# Patient Record
Sex: Female | Born: 1985 | Race: White | Hispanic: No | Marital: Married | Smoking: Current every day smoker
Health system: Southern US, Community
[De-identification: ages and names within clinical notes are randomized; demographics above are authoritative.]

## PROBLEM LIST (undated history)

## (undated) ENCOUNTER — Inpatient Hospital Stay (HOSPITAL_COMMUNITY): Payer: Self-pay

## (undated) ENCOUNTER — Ambulatory Visit: Admission: EM | Payer: Self-pay

## (undated) DIAGNOSIS — IMO0002 Reserved for concepts with insufficient information to code with codable children: Secondary | ICD-10-CM

## (undated) DIAGNOSIS — Z87440 Personal history of urinary (tract) infections: Secondary | ICD-10-CM

## (undated) DIAGNOSIS — N943 Premenstrual tension syndrome: Secondary | ICD-10-CM

## (undated) DIAGNOSIS — F419 Anxiety disorder, unspecified: Secondary | ICD-10-CM

## (undated) DIAGNOSIS — R002 Palpitations: Secondary | ICD-10-CM

## (undated) DIAGNOSIS — E079 Disorder of thyroid, unspecified: Secondary | ICD-10-CM

## (undated) DIAGNOSIS — R079 Chest pain, unspecified: Secondary | ICD-10-CM

## (undated) DIAGNOSIS — E039 Hypothyroidism, unspecified: Secondary | ICD-10-CM

## (undated) DIAGNOSIS — R0602 Shortness of breath: Secondary | ICD-10-CM

## (undated) DIAGNOSIS — R87619 Unspecified abnormal cytological findings in specimens from cervix uteri: Secondary | ICD-10-CM

## (undated) DIAGNOSIS — Z91014 Allergy to mammalian meats: Secondary | ICD-10-CM

## (undated) DIAGNOSIS — G43909 Migraine, unspecified, not intractable, without status migrainosus: Secondary | ICD-10-CM

## (undated) HISTORY — DX: Shortness of breath: R06.02

## (undated) HISTORY — PX: COLPOSCOPY: SHX161

## (undated) HISTORY — PX: WISDOM TOOTH EXTRACTION: SHX21

## (undated) HISTORY — DX: Premenstrual tension syndrome: N94.3

## (undated) HISTORY — DX: Personal history of urinary (tract) infections: Z87.440

## (undated) HISTORY — PX: OTHER SURGICAL HISTORY: SHX169

---
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
## 2006-01-04 ENCOUNTER — Ambulatory Visit: Payer: Self-pay | Admitting: Obstetrics and Gynecology

## 2006-01-09 ENCOUNTER — Ambulatory Visit (HOSPITAL_COMMUNITY): Admission: RE | Admit: 2006-01-09 | Discharge: 2006-01-09 | Payer: Self-pay | Admitting: Obstetrics and Gynecology

## 2008-04-28 ENCOUNTER — Emergency Department (HOSPITAL_COMMUNITY): Admission: EM | Admit: 2008-04-28 | Discharge: 2008-04-29 | Payer: Self-pay | Admitting: Emergency Medicine

## 2008-05-01 ENCOUNTER — Ambulatory Visit (HOSPITAL_COMMUNITY): Admission: RE | Admit: 2008-05-01 | Discharge: 2008-05-01 | Payer: Self-pay | Admitting: Gastroenterology

## 2010-10-21 NOTE — Group Therapy Note (Signed)
Kelly Kim, HERBST NO.:  [id number]   MEDICAL RECORD NO.:  [id number]          PATIENT TYPE:  WOC   LOCATION:  [facility]                   FACILITY:  [facility]   PHYSICIAN:  Argentina Donovan, MD        DATE OF BIRTH:  12/09/1985   DATE OF SERVICE:                                    CLINIC NOTE   CLINIC NOTE   DATE OF VISIT:  January 04, 2006.   SUBJECTIVE:  The patient is a 25 year old nulligravida, white female, with  severe dysmenorrhea for which she just recently started Lo-Ovral.  She is  seen with her mother.  We had a long discussion as to the possibilities.  She has had a sister, 41 years old, who lost an ovary and tube and has had  marked intraperitoneal scarring of the other fallopian tube secondary to  severe endometriosis.  The mother has a Mirena IUD because of severe  dysmenorrhea.  The patient is very concerned that she might have it.  She  has had CT scan some time ago showed ovarian cysts and she has been worried  every since.   PAST MEDICAL HISTORY:  Really noncontributory.   HABITS:  She does smoke but she does not drink alcohol or use illicit drugs.   PHYSICAL EXAMINATION:  VITAL SIGNS:  Her blood pressure is 132/73, pulse 92,  temperature 98.  Patient weighs 205 pounds is 5 feet 10 inches tall.  HEENT:  Within normal limits.  LUNGS:  Clear to auscultation and percussion.  HEART:  No murmurs, normal sinus rhythm.  ABDOMEN:  Soft, flat, nontender.  No masses or organomegaly.  No  costovertebral angle tenderness.  PELVIC:  External genitalia is normal.  BUS within normal limits.  Vagina is  clean, well rugated.  Cervix is clean and parous.  Uterus and adnexa could  not be outlined because of the habitus of patient.  EXTREMITIES:  No edema.  No varices.  Deep tendon reflexes within normal  limits.  SKIN:  Normal turgor and pallor with a mild amount of acne vulgaris on the  face since she started the Lo-Ovral.   DISCUSSION:  I have discussed with  the patient laparoscopy with possible  complications.  She did want this done and wanted Dr. Shawnie Pons to be involved  if possible, [facility]  We will  schedule her for diagnostic laparoscopy with Dr. Shawnie Pons present.  We will get  an ultrasound in advance.   DIAGNOSES:  1.  Dysmenorrhea.  2.  Premenstrual dysphoric disorder.  3.  Possible endometriosis.           ______________________________  Argentina Donovan, MD     PR/MEDQ  D:  01/04/2006  T:  01/04/2006  Job:  [id number]

## 2011-03-07 LAB — DIFFERENTIAL
Basophils Relative: 0
Eosinophils Absolute: 0.1
Eosinophils Relative: 1
Monocytes Absolute: 0.7
Neutro Abs: 9 — ABNORMAL HIGH
Neutrophils Relative %: 69

## 2011-03-07 LAB — URINALYSIS, ROUTINE W REFLEX MICROSCOPIC
Bilirubin Urine: NEGATIVE
Hgb urine dipstick: NEGATIVE
Specific Gravity, Urine: 1.019

## 2011-03-07 LAB — WET PREP, GENITAL

## 2011-03-07 LAB — COMPREHENSIVE METABOLIC PANEL
ALT: 26
AST: 17
Albumin: 3.5
Creatinine, Ser: 0.61
GFR calc Af Amer: >60
Glucose, Bld: 99
Potassium: 3.7
Total Protein: 6.7

## 2011-03-07 LAB — CBC
HCT: 40.4
Hemoglobin: 13.8
MCV: 89.8
RBC: 4.49
RDW: 12.1
WBC: 13 — ABNORMAL HIGH

## 2011-03-07 LAB — PREGNANCY, URINE: Preg Test, Ur: NEGATIVE

## 2011-06-06 NOTE — L&D Delivery Note (Signed)
Patient was C/C/+1 and pushed for 90 minutes with epidural.   NSVD  female infant, Apgars 9,9, weight P.   The patient had two 2nd degree labial and one 2nd midline lacerations repaired with 3-0 and 2-0 vicryl R with cath in place. Fundus was firm. EBL was expected. Placenta was delivered intact. Vagina was clear.  Baby was vigorous to bedside.  Dickey Caamano A

## 2011-07-18 ENCOUNTER — Inpatient Hospital Stay (HOSPITAL_COMMUNITY): Payer: Medicaid Other

## 2011-07-18 ENCOUNTER — Encounter (HOSPITAL_COMMUNITY): Payer: Self-pay | Admitting: *Deleted

## 2011-07-18 ENCOUNTER — Inpatient Hospital Stay (HOSPITAL_COMMUNITY)
Admission: AD | Admit: 2011-07-18 | Discharge: 2011-07-18 | Disposition: A | Discharge status: Home / Self Care | Payer: Medicaid Other | Source: Ambulatory Visit | Attending: Obstetrics and Gynecology | Admitting: Obstetrics and Gynecology

## 2011-07-18 DIAGNOSIS — F17200 Nicotine dependence, unspecified, uncomplicated: Secondary | ICD-10-CM | POA: Insufficient documentation

## 2011-07-18 DIAGNOSIS — Z3490 Encounter for supervision of normal pregnancy, unspecified, unspecified trimester: Secondary | ICD-10-CM

## 2011-07-18 DIAGNOSIS — R109 Unspecified abdominal pain: Secondary | ICD-10-CM | POA: Insufficient documentation

## 2011-07-18 DIAGNOSIS — M549 Dorsalgia, unspecified: Secondary | ICD-10-CM | POA: Insufficient documentation

## 2011-07-18 DIAGNOSIS — R350 Frequency of micturition: Secondary | ICD-10-CM | POA: Insufficient documentation

## 2011-07-18 DIAGNOSIS — J3489 Other specified disorders of nose and nasal sinuses: Secondary | ICD-10-CM | POA: Insufficient documentation

## 2011-07-18 DIAGNOSIS — Z1389 Encounter for screening for other disorder: Secondary | ICD-10-CM

## 2011-07-18 DIAGNOSIS — O210 Mild hyperemesis gravidarum: Secondary | ICD-10-CM | POA: Insufficient documentation

## 2011-07-18 DIAGNOSIS — N949 Unspecified condition associated with female genital organs and menstrual cycle: Secondary | ICD-10-CM | POA: Insufficient documentation

## 2011-07-18 HISTORY — DX: Unspecified abnormal cytological findings in specimens from cervix uteri: R87.619

## 2011-07-18 HISTORY — DX: Migraine, unspecified, not intractable, without status migrainosus: G43.909

## 2011-07-18 HISTORY — DX: Reserved for concepts with insufficient information to code with codable children: IMO0002

## 2011-07-18 HISTORY — DX: Hypothyroidism, unspecified: E03.9

## 2011-07-18 LAB — URINALYSIS, ROUTINE W REFLEX MICROSCOPIC
Hgb urine dipstick: NEGATIVE
Leukocytes, UA: NEGATIVE
Specific Gravity, Urine: <1.005 — ABNORMAL LOW (ref 1.005–1.030)

## 2011-07-18 LAB — CBC
Hemoglobin: 13 g/dL (ref 12.0–15.0)
MCH: 30.9 pg (ref 26.0–34.0)
MCHC: 33.9 g/dL (ref 30.0–36.0)
Platelets: 319 10*3/uL (ref 150–400)
RBC: 4.21 MIL/uL (ref 3.87–5.11)
RDW: 12.5 % (ref 11.5–15.5)
WBC: 9 10*3/uL (ref 4.0–10.5)

## 2011-07-18 LAB — DIFFERENTIAL
Basophils Absolute: 0 10*3/uL (ref 0.0–0.1)
Basophils Relative: 0 % (ref 0–1)
Eosinophils Absolute: 0.1 10*3/uL (ref 0.0–0.7)
Eosinophils Relative: 1 % (ref 0–5)
Lymphocytes Relative: 28 % (ref 12–46)
Lymphs Abs: 2.5 10*3/uL (ref 0.7–4.0)
Monocytes Relative: 6 % (ref 3–12)
Neutro Abs: 5.9 10*3/uL (ref 1.7–7.7)
Neutrophils Relative %: 66 % (ref 43–77)

## 2011-07-18 LAB — WET PREP, GENITAL
WBC, Wet Prep HPF POC: NONE SEEN
Yeast Wet Prep HPF POC: NONE SEEN

## 2011-07-18 LAB — ABO/RH: ABO/RH(D): B NEG

## 2011-07-18 LAB — HCG, QUANTITATIVE, PREGNANCY: hCG, Beta Chain, Quant, S: 8334 m[IU]/mL — ABNORMAL HIGH (ref <5)

## 2011-07-18 LAB — POCT PREGNANCY, URINE: Preg Test, Ur: POSITIVE — AB

## 2011-07-18 NOTE — ED Notes (Signed)
Concerned with weightt gain in past month of 10 pounds.

## 2011-07-18 NOTE — Progress Notes (Signed)
Pt states LMP-06/04/2011, had light spotting then. Cramping on left side, denies uti s/s. Denies abnormal vaginal d/c.

## 2011-07-18 NOTE — ED Notes (Signed)
C/o LLQ cramping, like "I am going to start my period."
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
## 2011-07-19 LAB — GC/CHLAMYDIA PROBE AMP, GENITAL
Chlamydia, DNA Probe: NEGATIVE
GC Probe Amp, Genital: NEGATIVE

## 2011-07-21 NOTE — ED Provider Notes (Signed)
Agree with above note.  Kelly Kim 07/21/2011 7:19 AM

## 2011-08-02 ENCOUNTER — Inpatient Hospital Stay (HOSPITAL_COMMUNITY)
Admission: AD | Admit: 2011-08-02 | Discharge: 2011-08-02 | Disposition: A | Discharge status: Home / Self Care | Payer: Medicaid Other | Source: Ambulatory Visit | Attending: Obstetrics & Gynecology | Admitting: Obstetrics & Gynecology

## 2011-08-02 ENCOUNTER — Encounter (HOSPITAL_COMMUNITY): Payer: Self-pay | Admitting: *Deleted

## 2011-08-02 DIAGNOSIS — J029 Acute pharyngitis, unspecified: Secondary | ICD-10-CM | POA: Insufficient documentation

## 2011-08-02 DIAGNOSIS — R51 Headache: Secondary | ICD-10-CM | POA: Insufficient documentation

## 2011-08-02 DIAGNOSIS — O99891 Other specified diseases and conditions complicating pregnancy: Secondary | ICD-10-CM | POA: Insufficient documentation

## 2011-08-02 LAB — URINALYSIS, ROUTINE W REFLEX MICROSCOPIC
Glucose, UA: NEGATIVE mg/dL
Ketones, ur: NEGATIVE mg/dL
Leukocytes, UA: NEGATIVE
Nitrite: NEGATIVE
Protein, ur: 100 mg/dL — AB

## 2011-08-02 LAB — CBC
Hemoglobin: 12.5 g/dL (ref 12.0–15.0)
MCH: 30.5 pg (ref 26.0–34.0)
MCHC: 33.7 g/dL (ref 30.0–36.0)
Platelets: 264 10*3/uL (ref 150–400)
RBC: 4.1 MIL/uL (ref 3.87–5.11)

## 2011-08-02 LAB — URINE MICROSCOPIC-ADD ON

## 2011-08-02 MED ORDER — AMOXICILLIN 500 MG PO CAPS: 500.0000 mg | ORAL_CAPSULE | Freq: Three times a day (TID) | ORAL | Status: AC

## 2011-08-02 MED ORDER — PROMETHAZINE HCL 25 MG PO TABS: 25.0000 mg | ORAL_TABLET | Freq: Four times a day (QID) | ORAL | Status: DC | PRN

## 2011-08-02 NOTE — ED Provider Notes (Signed)
Attestation of Attending Supervision of Advanced Practitioner: Evaluation and management procedures were performed by the PA/NP/CNM/OB Fellow under my supervision/collaboration. Chart reviewed, and agree with management and plan.  Kongmeng Santoro, M.D. 08/02/2011 10:25 AM   

## 2011-08-02 NOTE — Progress Notes (Signed)
Pt C/O sinus HA, drainage in throat, throat hurts & is swollen.  Vomitting since Saturday night, no diarrhea.  Temp at home 99.7

## 2011-08-02 NOTE — Discharge Instructions (Signed)
Strep Throat     Strep throat is an infection of the throat caused by a bacteria named Streptococcus pyogenes. Your caregiver may call the infection streptococcal "tonsillitis" or "pharyngitis" depending on whether there are signs of inflammation in the tonsils or back of the throat. Strep throat is most common in children from 5 to 26 years old during the cold months of the year, but it can occur in people of any age during any season. This infection is spread from person to person (contagious) through coughing, sneezing, or other close contact.  SYMPTOMS   · Fever or chills.   · Painful, swollen, red tonsils or throat.   · Pain or difficulty when swallowing.   · White or yellow spots on the tonsils or throat.   · Swollen, tender lymph nodes or "glands" of the neck or under the jaw.   · Red rash all over the body (rare).   DIAGNOSIS   Many different infections can cause the same symptoms. A test must be done to confirm the diagnosis so the right treatment can be given. A "rapid strep test" can help your caregiver make the diagnosis in a few minutes. If this test is not available, a light swab of the infected area can be used for a throat culture test. If a throat culture test is done, results are usually available in a day or two.  TREATMENT   Strep throat is treated with antibiotic medicine.  HOME CARE INSTRUCTIONS   · Gargle with 1 tsp of salt in 1 cup of warm water, 3 to 4 times per day or as needed for comfort.   · Family members who also have a sore throat or fever should be tested for strep throat and treated with antibiotics if they have the strep infection.   · Make sure everyone in your household washes their hands well.   · Do not share food, drinking cups, or personal items that could cause the infection to spread to others.   · You may need to eat a soft food diet until your sore throat gets better.   · Drink enough water and fluids to keep your urine clear or pale yellow. This will help prevent  dehydration.   · Get plenty of rest.   · Stay home from school, daycare, or work until you have been on antibiotics for 24 hours.   · Only take over-the-counter or prescription medicines for pain, discomfort, or fever as directed by your caregiver.   · If antibiotics are prescribed, take them as directed. Finish them even if you start to feel better.   SEEK MEDICAL CARE IF:   · The glands in your neck continue to enlarge.   · You develop a rash, cough, or earache.   · You cough up green, yellow-brown, or bloody sputum.   · You have pain or discomfort not controlled by medicines.   · Your problems seem to be getting worse rather than better.   SEEK IMMEDIATE MEDICAL CARE IF:   · You develop any new symptoms such as vomiting, severe headache, stiff or painful neck, chest pain, shortness of breath, or trouble swallowing.   · You develop severe throat pain, drooling, or changes in your voice.   · You develop swelling of the neck, or the skin on the neck becomes red and tender.   · You have a fever.   · You develop signs of dehydration, such as fatigue, dry mouth, and decreased urination.   · 
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
## 2012-03-08 ENCOUNTER — Other Ambulatory Visit: Payer: Self-pay | Admitting: Obstetrics and Gynecology

## 2012-03-08 ENCOUNTER — Inpatient Hospital Stay (HOSPITAL_COMMUNITY)
Admission: AD | Admit: 2012-03-08 | Discharge: 2012-03-11 | DRG: 774 | Disposition: A | Discharge status: Home / Self Care | Payer: Medicaid Other | Source: Ambulatory Visit | Attending: Obstetrics and Gynecology | Admitting: Obstetrics and Gynecology

## 2012-03-08 DIAGNOSIS — O1414 Severe pre-eclampsia complicating childbirth: Principal | ICD-10-CM | POA: Diagnosis present

## 2012-03-08 DIAGNOSIS — E079 Disorder of thyroid, unspecified: Secondary | ICD-10-CM | POA: Diagnosis present

## 2012-03-08 DIAGNOSIS — E039 Hypothyroidism, unspecified: Secondary | ICD-10-CM | POA: Diagnosis present

## 2012-03-08 LAB — CBC
Hemoglobin: 11.3 g/dL — ABNORMAL LOW (ref 12.0–15.0)
MCH: 30.9 pg (ref 26.0–34.0)
MCHC: 34.2 g/dL (ref 30.0–36.0)
Platelets: 249 10*3/uL (ref 150–400)
RBC: 3.66 MIL/uL — ABNORMAL LOW (ref 3.87–5.11)

## 2012-03-08 LAB — OB RESULTS CONSOLE HEPATITIS B SURFACE ANTIGEN: Hepatitis B Surface Ag: NEGATIVE

## 2012-03-08 LAB — TYPE AND SCREEN: ABO/RH(D): B NEG

## 2012-03-08 LAB — OB RESULTS CONSOLE RPR: RPR: NONREACTIVE

## 2012-03-08 MED ORDER — OXYTOCIN BOLUS FROM INFUSION
500.0000 mL | Freq: Once | INTRAVENOUS | Status: DC
  Filled 2012-03-08: qty 500

## 2012-03-08 MED ORDER — ACETAMINOPHEN 325 MG PO TABS
650.0000 mg | ORAL_TABLET | ORAL | Status: DC | PRN
  Administered 2012-03-09: 650 mg via ORAL
  Filled 2012-03-08: qty 2

## 2012-03-08 MED ORDER — OXYCODONE-ACETAMINOPHEN 5-325 MG PO TABS: 1.0000 | ORAL_TABLET | ORAL | Status: DC | PRN

## 2012-03-08 MED ORDER — OXYTOCIN 40 UNITS IN LACTATED RINGERS INFUSION - SIMPLE MED
1.0000 m[IU]/min | INTRAVENOUS | Status: DC
  Administered 2012-03-08: 2 m[IU]/min via INTRAVENOUS

## 2012-03-08 MED ORDER — LIDOCAINE HCL (PF) 1 % IJ SOLN
30.0000 mL | INTRAMUSCULAR | Status: DC | PRN
  Administered 2012-03-09: 30 mL via SUBCUTANEOUS
  Filled 2012-03-08: qty 30

## 2012-03-08 MED ORDER — CITRIC ACID-SODIUM CITRATE 334-500 MG/5ML PO SOLN
30.0000 mL | ORAL | Status: DC | PRN
  Administered 2012-03-08: 30 mL via ORAL
  Filled 2012-03-08 (×2): qty 15

## 2012-03-08 MED ORDER — TERBUTALINE SULFATE 1 MG/ML IJ SOLN: 0.2500 mg | Freq: Once | INTRAMUSCULAR | Status: AC | PRN

## 2012-03-08 MED ORDER — IBUPROFEN 600 MG PO TABS
600.0000 mg | ORAL_TABLET | Freq: Four times a day (QID) | ORAL | Status: DC | PRN
  Administered 2012-03-09: 600 mg via ORAL
  Filled 2012-03-08: qty 1

## 2012-03-08 MED ORDER — ONDANSETRON HCL 4 MG/2ML IJ SOLN
4.0000 mg | Freq: Four times a day (QID) | INTRAMUSCULAR | Status: DC | PRN
  Administered 2012-03-09: 4 mg via INTRAVENOUS
  Filled 2012-03-08: qty 2

## 2012-03-08 MED ORDER — OXYTOCIN 40 UNITS IN LACTATED RINGERS INFUSION - SIMPLE MED
62.5000 mL/h | Freq: Once | INTRAVENOUS | Status: AC
  Administered 2012-03-09: 62.5 mL/h via INTRAVENOUS
  Filled 2012-03-08: qty 1000

## 2012-03-08 MED ORDER — NALBUPHINE SYRINGE 5 MG/0.5 ML: 5.0000 mg | INJECTION | INTRAMUSCULAR | Status: DC | PRN

## 2012-03-08 MED ORDER — LACTATED RINGERS IV SOLN
INTRAVENOUS | Status: DC
  Administered 2012-03-08 – 2012-03-09 (×2): via INTRAVENOUS

## 2012-03-08 MED ORDER — FLEET ENEMA 7-19 GM/118ML RE ENEM: 1.0000 | ENEMA | RECTAL | Status: DC | PRN

## 2012-03-08 MED ORDER — LACTATED RINGERS IV SOLN: 500.0000 mL | INTRAVENOUS | Status: DC | PRN

## 2012-03-08 NOTE — Progress Notes (Signed)
Pt still comfortable. FHTs 120s gstv, NST R. Toco irreg SVE 4-5/70/-2, IUPC placed.
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
## 2012-03-09 ENCOUNTER — Encounter (HOSPITAL_COMMUNITY): Payer: Self-pay | Admitting: Anesthesiology

## 2012-03-09 ENCOUNTER — Encounter (HOSPITAL_COMMUNITY): Payer: Self-pay | Admitting: *Deleted

## 2012-03-09 ENCOUNTER — Inpatient Hospital Stay (HOSPITAL_COMMUNITY): Payer: Medicaid Other | Admitting: Anesthesiology

## 2012-03-09 LAB — RPR: RPR Ser Ql: NONREACTIVE

## 2012-03-09 MED ORDER — PRENATAL MULTIVITAMIN CH
1.0000 | ORAL_TABLET | Freq: Every day | ORAL | Status: DC
  Administered 2012-03-10 – 2012-03-11 (×2): 1 via ORAL
  Filled 2012-03-09 (×2): qty 1

## 2012-03-09 MED ORDER — METHYLERGONOVINE MALEATE 0.2 MG/ML IJ SOLN: 0.2000 mg | INTRAMUSCULAR | Status: DC | PRN

## 2012-03-09 MED ORDER — OXYCODONE-ACETAMINOPHEN 5-325 MG PO TABS: 1.0000 | ORAL_TABLET | ORAL | Status: DC | PRN

## 2012-03-09 MED ORDER — EPHEDRINE 5 MG/ML INJ: 10.0000 mg | INTRAVENOUS | Status: DC | PRN

## 2012-03-09 MED ORDER — SENNOSIDES-DOCUSATE SODIUM 8.6-50 MG PO TABS
2.0000 | ORAL_TABLET | Freq: Every day | ORAL | Status: DC
  Administered 2012-03-09 – 2012-03-10 (×2): 2 via ORAL

## 2012-03-09 MED ORDER — LACTATED RINGERS IV SOLN: 500.0000 mL | Freq: Once | INTRAVENOUS | Status: DC

## 2012-03-09 MED ORDER — LIDOCAINE HCL (PF) 1 % IJ SOLN
INTRAMUSCULAR | Status: DC | PRN
  Administered 2012-03-09 (×2): 4 mL

## 2012-03-09 MED ORDER — EPHEDRINE 5 MG/ML INJ
10.0000 mg | INTRAVENOUS | Status: DC | PRN
  Filled 2012-03-09: qty 4

## 2012-03-09 MED ORDER — PHENYLEPHRINE 40 MCG/ML (10ML) SYRINGE FOR IV PUSH (FOR BLOOD PRESSURE SUPPORT): 80.0000 ug | PREFILLED_SYRINGE | INTRAVENOUS | Status: DC | PRN

## 2012-03-09 MED ORDER — TETANUS-DIPHTH-ACELL PERTUSSIS 5-2.5-18.5 LF-MCG/0.5 IM SUSP
0.5000 mL | Freq: Once | INTRAMUSCULAR | Status: AC
  Administered 2012-03-10: 0.5 mL via INTRAMUSCULAR
  Filled 2012-03-09: qty 0.5

## 2012-03-09 MED ORDER — ONDANSETRON HCL 4 MG PO TABS: 4.0000 mg | ORAL_TABLET | ORAL | Status: DC | PRN

## 2012-03-09 MED ORDER — MAGNESIUM HYDROXIDE 400 MG/5ML PO SUSP: 30.0000 mL | ORAL | Status: DC | PRN

## 2012-03-09 MED ORDER — DIPHENHYDRAMINE HCL 50 MG/ML IJ SOLN: 12.5000 mg | INTRAMUSCULAR | Status: DC | PRN

## 2012-03-09 MED ORDER — OXYTOCIN 40 UNITS IN LACTATED RINGERS INFUSION - SIMPLE MED
1.0000 m[IU]/min | INTRAVENOUS | Status: DC
  Filled 2012-03-09: qty 1000

## 2012-03-09 MED ORDER — MEASLES, MUMPS & RUBELLA VAC ~~LOC~~ INJ
0.5000 mL | INJECTION | Freq: Once | SUBCUTANEOUS | Status: DC
  Filled 2012-03-09: qty 0.5

## 2012-03-09 MED ORDER — FENTANYL 2.5 MCG/ML BUPIVACAINE 1/10 % EPIDURAL INFUSION (WH - ANES)
INTRAMUSCULAR | Status: DC | PRN
  Administered 2012-03-09: 14 mL/h via EPIDURAL

## 2012-03-09 MED ORDER — FENTANYL 2.5 MCG/ML BUPIVACAINE 1/10 % EPIDURAL INFUSION (WH - ANES)
14.0000 mL/h | INTRAMUSCULAR | Status: DC
  Administered 2012-03-09 (×2): 14 mL/h via EPIDURAL
  Filled 2012-03-09 (×3): qty 123

## 2012-03-09 MED ORDER — DIPHENHYDRAMINE HCL 25 MG PO CAPS: 25.0000 mg | ORAL_CAPSULE | Freq: Four times a day (QID) | ORAL | Status: DC | PRN

## 2012-03-09 MED ORDER — SIMETHICONE 80 MG PO CHEW
80.0000 mg | CHEWABLE_TABLET | ORAL | Status: DC | PRN
  Administered 2012-03-10: 80 mg via ORAL

## 2012-03-09 MED ORDER — PHENYLEPHRINE 40 MCG/ML (10ML) SYRINGE FOR IV PUSH (FOR BLOOD PRESSURE SUPPORT)
80.0000 ug | PREFILLED_SYRINGE | INTRAVENOUS | Status: DC | PRN
  Filled 2012-03-09: qty 5

## 2012-03-09 MED ORDER — METHYLERGONOVINE MALEATE 0.2 MG PO TABS: 0.2000 mg | ORAL_TABLET | ORAL | Status: DC | PRN

## 2012-03-09 MED ORDER — LEVOTHYROXINE SODIUM 25 MCG PO TABS
25.0000 ug | ORAL_TABLET | Freq: Every day | ORAL | Status: DC
  Administered 2012-03-09: 25 ug via ORAL
  Filled 2012-03-09: qty 1

## 2012-03-09 MED ORDER — ONDANSETRON HCL 4 MG/2ML IJ SOLN: 4.0000 mg | INTRAMUSCULAR | Status: DC | PRN

## 2012-03-09 MED ORDER — LANOLIN HYDROUS EX OINT: TOPICAL_OINTMENT | CUTANEOUS | Status: DC | PRN

## 2012-03-09 MED ORDER — BENZOCAINE-MENTHOL 20-0.5 % EX AERO
1.0000 "application " | INHALATION_SPRAY | CUTANEOUS | Status: DC | PRN
  Administered 2012-03-11: 1 via TOPICAL
  Filled 2012-03-09 (×2): qty 56

## 2012-03-09 MED ORDER — SODIUM CHLORIDE 0.9 % IV SOLN: 250.0000 mL | INTRAVENOUS | Status: DC | PRN

## 2012-03-09 MED ORDER — DIBUCAINE 1 % RE OINT: 1.0000 "application " | TOPICAL_OINTMENT | RECTAL | Status: DC | PRN

## 2012-03-09 MED ORDER — WITCH HAZEL-GLYCERIN EX PADS: 1.0000 "application " | MEDICATED_PAD | CUTANEOUS | Status: DC | PRN

## 2012-03-09 MED ORDER — SODIUM CHLORIDE 0.9 % IJ SOLN: 3.0000 mL | INTRAMUSCULAR | Status: DC | PRN

## 2012-03-09 MED ORDER — SODIUM CHLORIDE 0.9 % IJ SOLN: 3.0000 mL | Freq: Two times a day (BID) | INTRAMUSCULAR | Status: DC

## 2012-03-09 MED ORDER — FERROUS SULFATE 325 (65 FE) MG PO TABS
325.0000 mg | ORAL_TABLET | Freq: Two times a day (BID) | ORAL | Status: DC
  Administered 2012-03-10 – 2012-03-11 (×2): 325 mg via ORAL
  Filled 2012-03-09 (×2): qty 1

## 2012-03-09 MED ORDER — ZOLPIDEM TARTRATE 5 MG PO TABS: 5.0000 mg | ORAL_TABLET | Freq: Every evening | ORAL | Status: DC | PRN

## 2012-03-09 MED ORDER — IBUPROFEN 800 MG PO TABS
800.0000 mg | ORAL_TABLET | Freq: Three times a day (TID) | ORAL | Status: DC
  Administered 2012-03-09 – 2012-03-11 (×5): 800 mg via ORAL
  Filled 2012-03-09 (×6): qty 1

## 2012-03-09 NOTE — Anesthesia Preprocedure Evaluation (Signed)
Anesthesia Evaluation  Patient identified by MRN, date of birth, ID band Patient awake    Reviewed: Allergy & Precautions, H&P , Patient's Chart, lab work & pertinent test results  Airway Mallampati: III TM Distance: >3 FB Neck ROM: full    Dental No notable dental hx. (+) Teeth Intact   Pulmonary Current Smoker,  breath sounds clear to auscultation  Pulmonary exam normal       Cardiovascular negative cardio ROS  Rhythm:regular Rate:Normal     Neuro/Psych negative psych ROS   GI/Hepatic negative GI ROS, Neg liver ROS, (+)     substance abuse  marijuana use,   Endo/Other  Hypothyroidism Morbid obesity  Renal/GU negative Renal ROS  negative genitourinary   Musculoskeletal   Abdominal   Peds  Hematology negative hematology ROS (+)   Anesthesia Other Findings   Reproductive/Obstetrics (+) Pregnancy                           Anesthesia Physical Anesthesia Plan  ASA: III  Anesthesia Plan: Epidural   Post-op Pain Management:    Induction:   Airway Management Planned:   Additional Equipment:   Intra-op Plan:   Post-operative Plan:   Informed Consent: I have reviewed the patients History and Physical, chart, labs and discussed the procedure including the risks, benefits and alternatives for the proposed anesthesia with the patient or authorized representative who has indicated his/her understanding and acceptance.     Plan Discussed with: Anesthesiologist  Anesthesia Plan Comments:         Anesthesia Quick Evaluation

## 2012-03-09 NOTE — Progress Notes (Signed)
0810 vomiting about 250cc brown clear emesis offered zofran 2-3x-pt has declined. Pt remineded to ask for zofran when she wants it.

## 2012-03-09 NOTE — Anesthesia Postprocedure Evaluation (Signed)
  Anesthesia Post-op Note  Patient: Kelly Kim  Procedure(s) Performed: * No procedures listed *  Patient Location: PACU and Mother/Baby  Anesthesia Type: Epidural  Level of Consciousness: awake, alert  and oriented  Airway and Oxygen Therapy: Patient Spontanous Breathing    Post-op Assessment: Patient's Cardiovascular Status Stable and Respiratory Function Stable  Post-op Vital Signs: stable  Complications: No apparent anesthesia complications

## 2012-03-09 NOTE — Progress Notes (Signed)
Pt comfortable.  FHTs 120s, gstv, NST R. Toco q3 SVE per nurse 8 cm.  Expect SVD. T

## 2012-03-09 NOTE — Anesthesia Procedure Notes (Signed)
Epidural Patient location during procedure: OB Start time: 03/09/2012 1:23 AM  Staffing Anesthesiologist: Malen Gauze, Mckade Gurka A. Performed by: anesthesiologist   Preanesthetic Checklist Completed: patient identified, site marked, surgical consent, pre-op evaluation, timeout performed, IV checked, risks and benefits discussed and monitors and equipment checked  Epidural Patient position: sitting Prep: site prepped and draped and DuraPrep Patient monitoring: continuous pulse ox and blood pressure Approach: midline Injection technique: LOR air  Needle:  Needle type: Tuohy  Needle gauge: 17 G Needle length: 9 cm and 9 Needle insertion depth: 7 cm Catheter type: closed end flexible Catheter size: 19 Gauge Catheter at skin depth: 12 cm Test dose: negative and Other  Assessment Events: blood not aspirated, injection not painful, no injection resistance, negative IV test and no paresthesia  Additional Notes Patient identified. Risks and benefits discussed including failed block, incomplete  Pain control, post dural puncture headache, nerve damage, paralysis, blood pressure Changes, nausea, vomiting, reactions to medications-both toxic and allergic and post Partum back pain. All questions were answered. Patient expressed understanding and wished to proceed. Sterile technique was used throughout procedure. Epidural site was Dressed with sterile barrier dressing. No paresthesias, signs of intravascular injection Or signs of intrathecal spread were encountered.  Patient was more comfortable after the epidural was dosed. Please see RN's note for documentation of vital signs and FHR which are stable.

## 2012-03-10 LAB — CBC
Hemoglobin: 8.6 g/dL — ABNORMAL LOW (ref 12.0–15.0)
MCH: 31 pg (ref 26.0–34.0)
MCHC: 34.3 g/dL (ref 30.0–36.0)
RDW: 13.5 % (ref 11.5–15.5)

## 2012-03-10 MED ORDER — LEVOTHYROXINE SODIUM 25 MCG PO TABS
25.0000 ug | ORAL_TABLET | Freq: Every day | ORAL | Status: DC
  Administered 2012-03-10 – 2012-03-11 (×2): 25 ug via ORAL
  Filled 2012-03-10 (×2): qty 1

## 2012-03-10 MED ORDER — INFLUENZA VIRUS VACC SPLIT PF IM SUSP
0.5000 mL | INTRAMUSCULAR | Status: AC
  Administered 2012-03-11: 0.5 mL via INTRAMUSCULAR
  Filled 2012-03-10: qty 0.5

## 2012-03-10 MED ORDER — RHO D IMMUNE GLOBULIN 1500 UNIT/2ML IJ SOLN
300.0000 ug | Freq: Once | INTRAMUSCULAR | Status: AC
  Administered 2012-03-10: 300 ug via INTRAMUSCULAR
  Filled 2012-03-10: qty 2

## 2012-03-10 NOTE — Progress Notes (Signed)
Patient is eating, ambulating, voiding.  Pain control is good.  Filed Vitals:   03/09/12 1804 03/09/12 1845 03/09/12 2015 03/10/12 0010  BP: 94/58 95/69 116/78 115/80  Pulse: 96 105 98 103  Temp:  99.2 F (37.3 C) 98.1 F (36.7 C) 97.6 F (36.4 C)  TempSrc:  Oral Oral Oral  Resp: 20 18 18 18   Height:      Weight:      SpO2:        Fundus firm Perineum without swelling.  Lab Results  Component Value Date   WBC 11.2* 03/08/2012   HGB 11.3* 03/08/2012   HCT 33.0* 03/08/2012   MCV 90.2 03/08/2012   PLT 249 03/08/2012    --/--/B NEG (10/04 1541)/RI  A/P Post partum day 1.  Baby O+- pt needs Rhogam  Routine care.  Expect d/c tomorrow.    Rush Salce A

## 2012-03-11 LAB — RH IG WORKUP (INCLUDES ABO/RH): Gestational Age(Wks): 39

## 2012-03-11 MED ORDER — IBUPROFEN 800 MG PO TABS: 800.0000 mg | ORAL_TABLET | Freq: Three times a day (TID) | ORAL | Status: DC | PRN

## 2012-03-11 MED ORDER — PNEUMOCOCCAL VAC POLYVALENT 25 MCG/0.5ML IJ INJ
0.5000 mL | INJECTION | Freq: Once | INTRAMUSCULAR | Status: AC
  Administered 2012-03-11: 0.5 mL via INTRAMUSCULAR
  Filled 2012-03-11: qty 0.5

## 2012-03-11 NOTE — Discharge Summary (Signed)
Obstetric Discharge Summary Reason for Admission: induction of labor, Severe Preeclampsia Prenatal Procedures: Preeclampsia Intrapartum Procedures: spontaneous vaginal delivery Postpartum Procedures: none Complications-Operative and Postpartum: 2nd degree perineal laceration and bilateral labial laceration Hemoglobin  Date Value Range Status  03/10/2012 8.6* 12.0 - 15.0 g/dL Final     DELTA CHECK NOTED     REPEATED TO VERIFY     HCT  Date Value Range Status  03/10/2012 25.1* 36.0 - 46.0 % Final    Physical Exam:  General: alert Lochia: appropriate Uterine Fundus: firm  Discharge Diagnoses: Term Pregnancy-delivered and Preelampsia  Discharge Information: Date: 03/11/2012 Activity: pelvic rest Diet: routine Medications: PNV and Ibuprofen Condition: stable Instructions: refer to practice specific booklet Discharge to: home Follow-up Information    Follow up with HORVATH,MICHELLE A, MD. In 4 weeks.   Contact information:   [address] Dorothy[address] Gibbs [place] [address] [phone]          Newborn Data: Live born female  Birth Weight: 8 lb 6.4 oz (3810 g) APGAR: 9, 9  Home with mother.  Epic Tribbett D 03/11/2012, 9:21 AM

## 2012-03-17 ENCOUNTER — Encounter (HOSPITAL_COMMUNITY): Payer: Self-pay

## 2012-03-17 ENCOUNTER — Inpatient Hospital Stay (HOSPITAL_COMMUNITY)
Admission: AD | Admit: 2012-03-17 | Discharge: 2012-03-18 | Discharge status: Against Medical Advice | Payer: Medicaid Other | Source: Ambulatory Visit | Attending: Obstetrics and Gynecology | Admitting: Obstetrics and Gynecology

## 2012-03-17 DIAGNOSIS — O99893 Other specified diseases and conditions complicating puerperium: Secondary | ICD-10-CM | POA: Insufficient documentation

## 2012-03-17 DIAGNOSIS — R51 Headache: Secondary | ICD-10-CM | POA: Insufficient documentation

## 2012-03-17 LAB — CBC WITH DIFFERENTIAL/PLATELET
Blasts: 0 %
Hemoglobin: 9.8 g/dL — ABNORMAL LOW (ref 12.0–15.0)
Lymphocytes Relative: 19 % (ref 12–46)
Lymphs Abs: 1.9 10*3/uL (ref 0.7–4.0)
MCHC: 33.7 g/dL (ref 30.0–36.0)
Neutro Abs: 8 10*3/uL — ABNORMAL HIGH (ref 1.7–7.7)
Neutrophils Relative %: 79 % — ABNORMAL HIGH (ref 43–77)
Promyelocytes Absolute: 0 %
RDW: 13.1 % (ref 11.5–15.5)
nRBC: 0 /100 WBC

## 2012-03-17 LAB — LACTATE DEHYDROGENASE: LDH: 210 U/L (ref 94–250)

## 2012-03-17 LAB — COMPREHENSIVE METABOLIC PANEL
CO2: 24 mEq/L (ref 19–32)
Calcium: 8.9 mg/dL (ref 8.4–10.5)
Creatinine, Ser: 0.86 mg/dL (ref 0.50–1.10)
GFR calc Af Amer: >90 mL/min (ref >90)
GFR calc non Af Amer: >90 mL/min (ref >90)
Glucose, Bld: 90 mg/dL (ref 70–99)

## 2012-03-17 LAB — URINALYSIS, ROUTINE W REFLEX MICROSCOPIC
Ketones, ur: NEGATIVE mg/dL
Nitrite: NEGATIVE
Protein, ur: NEGATIVE mg/dL
pH: 6.5 (ref 5.0–8.0)

## 2012-03-17 LAB — URINE MICROSCOPIC-ADD ON
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
## 2012-03-17 NOTE — MAU Note (Signed)
Pt reports she had a vaginal delivery on 10/05, was induced for elevated B/P and protein in her urine. Checked b/p at [facility] and it was 160/107, headache, swollen ankles and feet, spots in visual field

## 2012-03-18 MED ORDER — LABETALOL HCL 100 MG PO TABS
200.0000 mg | ORAL_TABLET | Freq: Two times a day (BID) | ORAL | Status: DC
  Administered 2012-03-18: 200 mg via ORAL
  Filled 2012-03-18: qty 2

## 2012-03-18 NOTE — MAU Note (Signed)
Pt states will sign out AMA b/c doesn't want to bring her newborn back to the hospital around the germs. Patient states will call the office first thing in the morning for an appointment, and will return to MAU if symptoms worsen.

## 2013-06-05 NOTE — L&D Delivery Note (Signed)
Patient was C/C/+3 and pushed for 5 minutes with epidural.     Head delivered and rotated up to ceiling face up; anterior shoulder dystocia (L).  Shoulder dystocia relieved in 1 min 23 sec after McRoberts, suprapubic pressure, woods screw and finally delivery of posterior (R) arm.  SVD infant, Apgars 8,9, weight 10#3.[name].   The patient had one midline second degree episiotomy repaired with 2-0 vicryl R and one laceration sub clitoral first degree repaired with 3-0 vicryl R and red rubber cath. Fundus was firm. EBL was expected. Placenta was delivered intact. Vagina was clear.  Baby was vigorous and doing skin to skin with mother.  Baby's bilateral arms were moving well with good strength and no abnormalities of the clavicles.  Baby checked by Neo.  Kelly Kim

## 2013-09-04 LAB — OB RESULTS CONSOLE ANTIBODY SCREEN: ANTIBODY SCREEN: NEGATIVE

## 2013-09-04 LAB — OB RESULTS CONSOLE RPR: RPR: NONREACTIVE

## 2013-09-04 LAB — OB RESULTS CONSOLE ABO/RH: RH Type: NEGATIVE

## 2013-09-04 LAB — OB RESULTS CONSOLE RUBELLA ANTIBODY, IGM: Rubella: IMMUNE

## 2013-09-04 LAB — OB RESULTS CONSOLE GC/CHLAMYDIA
CHLAMYDIA, DNA PROBE: NEGATIVE
GC PROBE AMP, GENITAL: NEGATIVE

## 2013-09-04 LAB — OB RESULTS CONSOLE HEPATITIS B SURFACE ANTIGEN: Hepatitis B Surface Ag: NEGATIVE

## 2013-09-04 LAB — OB RESULTS CONSOLE HIV ANTIBODY (ROUTINE TESTING): HIV: NONREACTIVE

## 2013-10-07 ENCOUNTER — Other Ambulatory Visit (HOSPITAL_COMMUNITY): Payer: Self-pay | Admitting: Obstetrics and Gynecology

## 2013-10-07 ENCOUNTER — Encounter (HOSPITAL_COMMUNITY): Payer: Self-pay | Admitting: Obstetrics and Gynecology

## 2013-10-07 DIAGNOSIS — Z3689 Encounter for other specified antenatal screening: Secondary | ICD-10-CM

## 2013-10-07 DIAGNOSIS — O1210 Gestational proteinuria, unspecified trimester: Secondary | ICD-10-CM

## 2013-10-21 ENCOUNTER — Encounter (HOSPITAL_COMMUNITY): Payer: Self-pay

## 2013-10-21 ENCOUNTER — Ambulatory Visit (HOSPITAL_COMMUNITY)
Admission: RE | Admit: 2013-10-21 | Discharge: 2013-10-21 | Disposition: A | Discharge status: Home / Self Care | Payer: Medicaid Other | Source: Ambulatory Visit | Attending: Obstetrics and Gynecology | Admitting: Obstetrics and Gynecology

## 2013-10-21 ENCOUNTER — Other Ambulatory Visit (HOSPITAL_COMMUNITY): Payer: Self-pay | Admitting: Obstetrics and Gynecology

## 2013-10-21 VITALS — BP 116/73 | HR 102 | Wt 284.0 lb

## 2013-10-21 DIAGNOSIS — O1210 Gestational proteinuria, unspecified trimester: Secondary | ICD-10-CM

## 2013-10-21 DIAGNOSIS — E079 Disorder of thyroid, unspecified: Secondary | ICD-10-CM | POA: Insufficient documentation

## 2013-10-21 DIAGNOSIS — Z3689 Encounter for other specified antenatal screening: Secondary | ICD-10-CM

## 2013-10-21 DIAGNOSIS — Z1389 Encounter for screening for other disorder: Secondary | ICD-10-CM

## 2013-10-21 DIAGNOSIS — E039 Hypothyroidism, unspecified: Secondary | ICD-10-CM | POA: Insufficient documentation

## 2013-10-21 DIAGNOSIS — O99280 Endocrine, nutritional and metabolic diseases complicating pregnancy, unspecified trimester: Secondary | ICD-10-CM

## 2013-10-21 DIAGNOSIS — O26839 Pregnancy related renal disease, unspecified trimester: Secondary | ICD-10-CM | POA: Insufficient documentation

## 2013-10-21 DIAGNOSIS — O99330 Smoking (tobacco) complicating pregnancy, unspecified trimester: Secondary | ICD-10-CM | POA: Insufficient documentation
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
## 2013-10-23 ENCOUNTER — Other Ambulatory Visit: Payer: Self-pay

## 2013-10-28 ENCOUNTER — Other Ambulatory Visit (HOSPITAL_COMMUNITY): Payer: Self-pay | Admitting: Obstetrics and Gynecology

## 2013-10-28 DIAGNOSIS — R809 Proteinuria, unspecified: Secondary | ICD-10-CM

## 2013-10-30 ENCOUNTER — Ambulatory Visit (HOSPITAL_COMMUNITY): Payer: Medicaid Other

## 2013-11-17 ENCOUNTER — Other Ambulatory Visit (HOSPITAL_COMMUNITY): Payer: Self-pay | Admitting: Obstetrics and Gynecology

## 2013-11-17 DIAGNOSIS — R809 Proteinuria, unspecified: Secondary | ICD-10-CM

## 2013-11-18 ENCOUNTER — Ambulatory Visit (HOSPITAL_COMMUNITY): Admission: RE | Admit: 2013-11-18 | Payer: Medicaid Other | Source: Ambulatory Visit

## 2013-11-24 ENCOUNTER — Ambulatory Visit (HOSPITAL_COMMUNITY)
Admission: RE | Admit: 2013-11-24 | Discharge: 2013-11-24 | Disposition: A | Discharge status: Home / Self Care | Payer: Medicaid Other | Source: Ambulatory Visit | Attending: Obstetrics and Gynecology | Admitting: Obstetrics and Gynecology

## 2013-11-24 DIAGNOSIS — R809 Proteinuria, unspecified: Secondary | ICD-10-CM | POA: Insufficient documentation

## 2013-12-08 ENCOUNTER — Ambulatory Visit (HOSPITAL_COMMUNITY)
Admission: RE | Admit: 2013-12-08 | Discharge: 2013-12-08 | Disposition: A | Discharge status: Home / Self Care | Payer: Medicaid Other | Source: Ambulatory Visit | Attending: Obstetrics and Gynecology | Admitting: Obstetrics and Gynecology

## 2013-12-08 ENCOUNTER — Encounter (HOSPITAL_COMMUNITY): Payer: Self-pay

## 2013-12-08 DIAGNOSIS — Z363 Encounter for antenatal screening for malformations: Secondary | ICD-10-CM | POA: Insufficient documentation

## 2013-12-08 DIAGNOSIS — Z1389 Encounter for screening for other disorder: Secondary | ICD-10-CM | POA: Insufficient documentation

## 2013-12-16 ENCOUNTER — Other Ambulatory Visit (HOSPITAL_COMMUNITY): Payer: Self-pay | Admitting: Obstetrics and Gynecology

## 2013-12-16 DIAGNOSIS — O99210 Obesity complicating pregnancy, unspecified trimester: Secondary | ICD-10-CM

## 2013-12-16 DIAGNOSIS — O10019 Pre-existing essential hypertension complicating pregnancy, unspecified trimester: Secondary | ICD-10-CM

## 2013-12-16 DIAGNOSIS — E669 Obesity, unspecified: Secondary | ICD-10-CM

## 2014-01-02 ENCOUNTER — Ambulatory Visit (HOSPITAL_COMMUNITY): Admission: RE | Admit: 2014-01-02 | Payer: Medicaid Other | Source: Ambulatory Visit

## 2014-01-12 ENCOUNTER — Encounter (HOSPITAL_COMMUNITY): Payer: Self-pay | Admitting: *Deleted

## 2014-01-12 ENCOUNTER — Inpatient Hospital Stay (HOSPITAL_COMMUNITY)
Admission: AD | Admit: 2014-01-12 | Discharge: 2014-01-12 | Disposition: A | Discharge status: Home / Self Care | Payer: Medicaid Other | Source: Ambulatory Visit | Attending: Obstetrics & Gynecology | Admitting: Obstetrics & Gynecology

## 2014-01-12 DIAGNOSIS — R0602 Shortness of breath: Secondary | ICD-10-CM | POA: Insufficient documentation

## 2014-01-12 DIAGNOSIS — R209 Unspecified disturbances of skin sensation: Secondary | ICD-10-CM | POA: Insufficient documentation

## 2014-01-12 DIAGNOSIS — O99330 Smoking (tobacco) complicating pregnancy, unspecified trimester: Secondary | ICD-10-CM | POA: Diagnosis not present

## 2014-01-12 DIAGNOSIS — R51 Headache: Secondary | ICD-10-CM | POA: Diagnosis present

## 2014-01-12 DIAGNOSIS — O26893 Other specified pregnancy related conditions, third trimester: Secondary | ICD-10-CM

## 2014-01-12 DIAGNOSIS — O9989 Other specified diseases and conditions complicating pregnancy, childbirth and the puerperium: Secondary | ICD-10-CM

## 2014-01-12 DIAGNOSIS — O99891 Other specified diseases and conditions complicating pregnancy: Secondary | ICD-10-CM | POA: Diagnosis not present

## 2014-01-12 LAB — COMPREHENSIVE METABOLIC PANEL
ALBUMIN: 2.5 g/dL — AB (ref 3.5–5.2)
ALK PHOS: 83 U/L (ref 39–117)
ALT: 14 U/L (ref 0–35)
ANION GAP: 12 (ref 5–15)
AST: 13 U/L (ref 0–37)
BUN: 5 mg/dL — AB (ref 6–23)
CHLORIDE: 104 meq/L (ref 96–112)
CO2: 23 meq/L (ref 19–32)
CREATININE: 0.51 mg/dL (ref 0.50–1.10)
Calcium: 8.7 mg/dL (ref 8.4–10.5)
GFR calc Af Amer: >90 mL/min (ref >90)
GLUCOSE: 94 mg/dL (ref 70–99)
POTASSIUM: 3.5 meq/L — AB (ref 3.7–5.3)
Sodium: 139 mEq/L (ref 137–147)
Total Protein: 5.6 g/dL — ABNORMAL LOW (ref 6.0–8.3)

## 2014-01-12 LAB — URINALYSIS, ROUTINE W REFLEX MICROSCOPIC
Bilirubin Urine: NEGATIVE
GLUCOSE, UA: NEGATIVE mg/dL
HGB URINE DIPSTICK: NEGATIVE
Ketones, ur: NEGATIVE mg/dL
Nitrite: NEGATIVE
PH: 6 (ref 5.0–8.0)
Protein, ur: NEGATIVE mg/dL
Urobilinogen, UA: 0.2 mg/dL (ref 0.0–1.0)

## 2014-01-12 LAB — CBC
HEMATOCRIT: 32 % — AB (ref 36.0–46.0)
HEMOGLOBIN: 10.8 g/dL — AB (ref 12.0–15.0)
MCH: 30 pg (ref 26.0–34.0)
MCHC: 33.8 g/dL (ref 30.0–36.0)
MCV: 88.9 fL (ref 78.0–100.0)
Platelets: 223 10*3/uL (ref 150–400)
RBC: 3.6 MIL/uL — AB (ref 3.87–5.11)
RDW: 13.5 % (ref 11.5–15.5)
WBC: 11.9 10*3/uL — AB (ref 4.0–10.5)

## 2014-01-12 LAB — PROTEIN / CREATININE RATIO, URINE
Creatinine, Urine: 44.88 mg/dL
Protein Creatinine Ratio: 0.22 — ABNORMAL HIGH (ref 0.00–0.15)
Total Protein, Urine: 9.9 mg/dL

## 2014-01-12 LAB — URINE MICROSCOPIC-ADD ON

## 2014-01-12 MED ORDER — BUTALBITAL-APAP-CAFFEINE 50-325-40 MG PO TABS: 1.0000 | ORAL_TABLET | Freq: Four times a day (QID) | ORAL | Status: DC | PRN

## 2014-01-12 NOTE — MAU Note (Signed)
Headache x 5 days, frontal & left ear pain; took sudafed & tylenol with minimal relief. Trouble breathing x 2 days; feels congested & can't get deep breaths. No cough. Area on front of left shin numbness today. Denies injury to leg.  Denies vaginal bleeding, LOF or discharge. Positive fetal movement. States has been having irregular braxton hicks x 2 weeks.
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
## 2014-01-12 NOTE — Discharge Instructions (Signed)
Third Trimester of Pregnancy The third trimester is from week 29 through week 42, months 7 through 9. The third trimester is a time when the fetus is growing rapidly. At the end of the ninth month, the fetus is about 20 inches in length and weighs 6-10 pounds.  BODY CHANGES Your body goes through many changes during pregnancy. The changes vary from woman to woman.   Your weight will continue to increase. You can expect to gain 25-35 pounds (11-16 kg) by the end of the pregnancy.  You may begin to get stretch marks on your hips, abdomen, and breasts.  You may urinate more often because the fetus is moving lower into your pelvis and pressing on your bladder.  You may develop or continue to have heartburn as a result of your pregnancy.  You may develop constipation because certain hormones are causing the muscles that push waste through your intestines to slow down.  You may develop hemorrhoids or swollen, bulging veins (varicose veins).  You may have pelvic pain because of the weight gain and pregnancy hormones relaxing your joints between the bones in your pelvis. Backaches may result from overexertion of the muscles supporting your posture.  You may have changes in your hair. These can include thickening of your hair, rapid growth, and changes in texture. Some women also have hair loss during or after pregnancy, or hair that feels dry or thin. Your hair will most likely return to normal after your baby is born.  Your breasts will continue to grow and be tender. A yellow discharge may leak from your breasts called colostrum.  Your belly button may stick out.  You may feel short of breath because of your expanding uterus.  You may notice the fetus "dropping," or moving lower in your abdomen.  You may have a bloody mucus discharge. This usually occurs a few days to a week before labor begins.  Your cervix becomes thin and soft (effaced) near your due date. WHAT TO EXPECT AT YOUR PRENATAL  EXAMS  You will have prenatal exams every 2 weeks until week 36. Then, you will have weekly prenatal exams. During a routine prenatal visit:  You will be weighed to make sure you and the fetus are growing normally.  Your blood pressure is taken.  Your abdomen will be measured to track your baby's growth.  The fetal heartbeat will be listened to.  Any test results from the previous visit will be discussed.  You may have a cervical check near your due date to see if you have effaced. At around 36 weeks, your caregiver will check your cervix. At the same time, your caregiver will also perform a test on the secretions of the vaginal tissue. This test is to determine if a type of bacteria, Group B streptococcus, is present. Your caregiver will explain this further. Your caregiver may ask you:  What your birth plan is.  How you are feeling.  If you are feeling the baby move.  If you have had any abnormal symptoms, such as leaking fluid, bleeding, severe headaches, or abdominal cramping.  If you have any questions. Other tests or screenings that may be performed during your third trimester include:  Blood tests that check for low iron levels (anemia).  Fetal testing to check the health, activity level, and growth of the fetus. Testing is done if you have certain medical conditions or if there are problems during the pregnancy. FALSE LABOR You may feel small, irregular contractions that   eventually go away. These are called Braxton Hicks contractions, or false labor. Contractions may last for hours, days, or even weeks before true labor sets in. If contractions come at regular intervals, intensify, or become painful, it is best to be seen by your caregiver.  SIGNS OF LABOR   Menstrual-like cramps.  Contractions that are 5 minutes apart or less.  Contractions that start on the top of the uterus and spread down to the lower abdomen and back.  A sense of increased pelvic pressure or back  pain.  A watery or bloody mucus discharge that comes from the vagina. If you have any of these signs before the 37th week of pregnancy, call your caregiver right away. You need to go to the hospital to get checked immediately. HOME CARE INSTRUCTIONS   Avoid all smoking, herbs, alcohol, and unprescribed drugs. These chemicals affect the formation and growth of the baby.  Follow your caregiver's instructions regarding medicine use. There are medicines that are either safe or unsafe to take during pregnancy.  Exercise only as directed by your caregiver. Experiencing uterine cramps is a good sign to stop exercising.  Continue to eat regular, healthy meals.  Wear a good support bra for breast tenderness.  Do not use hot tubs, steam rooms, or saunas.  Wear your seat belt at all times when driving.  Avoid raw meat, uncooked cheese, cat litter boxes, and soil used by cats. These carry germs that can cause birth defects in the baby.  Take your prenatal vitamins.  Try taking a stool softener (if your caregiver approves) if you develop constipation. Eat more high-fiber foods, such as fresh vegetables or fruit and whole grains. Drink plenty of fluids to keep your urine clear or pale yellow.  Take warm sitz baths to soothe any pain or discomfort caused by hemorrhoids. Use hemorrhoid cream if your caregiver approves.  If you develop varicose veins, wear support hose. Elevate your feet for 15 minutes, 3-4 times a day. Limit salt in your diet.  Avoid heavy lifting, wear low heal shoes, and practice good posture.  Rest a lot with your legs elevated if you have leg cramps or low back pain.  Visit your dentist if you have not gone during your pregnancy. Use a soft toothbrush to brush your teeth and be gentle when you floss.  A sexual relationship may be continued unless your caregiver directs you otherwise.  Do not travel far distances unless it is absolutely necessary and only with the approval  of your caregiver.  Take prenatal classes to understand, practice, and ask questions about the labor and delivery.  Make a trial run to the hospital.  Pack your hospital bag.  Prepare the baby's nursery.  Continue to go to all your prenatal visits as directed by your caregiver. SEEK MEDICAL CARE IF:  You are unsure if you are in labor or if your water has broken.  You have dizziness.  You have mild pelvic cramps, pelvic pressure, or nagging pain in your abdominal area.  You have persistent nausea, vomiting, or diarrhea.  You have a bad smelling vaginal discharge.  You have pain with urination. SEEK IMMEDIATE MEDICAL CARE IF:   You have a fever.  You are leaking fluid from your vagina.  You have spotting or bleeding from your vagina.  You have severe abdominal cramping or pain.  You have rapid weight loss or gain.  You have shortness of breath with chest pain.  You notice sudden or extreme swelling   of your face, hands, ankles, feet, or legs.  You have not felt your baby move in over an hour.  You have severe headaches that do not go away with medicine.  You have vision changes. Document Released: 05/16/2001 Document Revised: 05/27/2013 Document Reviewed: 07/23/2012 ExitCare Patient Information 2015 [employer]. This information is not intended to replace advice given to you by your health care provider. Make sure you discuss any questions you have with your health care provider.  

## 2014-01-13 NOTE — MAU Provider Note (Signed)
Reviewed case with CNM and I agree with assessment and plan

## 2014-02-10 ENCOUNTER — Other Ambulatory Visit (HOSPITAL_COMMUNITY): Payer: Self-pay | Admitting: Obstetrics and Gynecology

## 2014-02-10 DIAGNOSIS — O99810 Abnormal glucose complicating pregnancy: Secondary | ICD-10-CM

## 2014-02-13 ENCOUNTER — Ambulatory Visit (HOSPITAL_COMMUNITY): Payer: Medicaid Other

## 2014-02-17 ENCOUNTER — Inpatient Hospital Stay (HOSPITAL_COMMUNITY): Payer: Medicaid Other

## 2014-02-17 ENCOUNTER — Inpatient Hospital Stay (HOSPITAL_COMMUNITY)
Admission: AD | Admit: 2014-02-17 | Discharge: 2014-02-17 | Disposition: A | Discharge status: Home / Self Care | Payer: Medicaid Other | Source: Ambulatory Visit | Attending: Obstetrics & Gynecology | Admitting: Obstetrics & Gynecology

## 2014-02-17 ENCOUNTER — Encounter (HOSPITAL_COMMUNITY): Payer: Self-pay | Admitting: *Deleted

## 2014-02-17 DIAGNOSIS — O368390 Maternal care for abnormalities of the fetal heart rate or rhythm, unspecified trimester, not applicable or unspecified: Secondary | ICD-10-CM | POA: Insufficient documentation

## 2014-02-17 DIAGNOSIS — Z3689 Encounter for other specified antenatal screening: Secondary | ICD-10-CM

## 2014-02-17 DIAGNOSIS — Z36 Encounter for antenatal screening of mother: Secondary | ICD-10-CM | POA: Diagnosis not present

## 2014-02-17 DIAGNOSIS — O99330 Smoking (tobacco) complicating pregnancy, unspecified trimester: Secondary | ICD-10-CM | POA: Diagnosis not present

## 2014-02-17 LAB — OB RESULTS CONSOLE GBS: STREP GROUP B AG: NEGATIVE

## 2014-02-17 NOTE — MAU Provider Note (Signed)
Reviewed case with CNM and I agree with above  Kelly Kim STACIA  
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
## 2014-02-17 NOTE — MAU Note (Signed)
Patient states she was in the office today and had a NST with drops below the baseline in the fetal heart rate. no leaking or bleeding.

## 2014-02-17 NOTE — Discharge Instructions (Signed)
Nonstress Test °The nonstress test is a procedure that monitors the fetus's heartbeat. The test will monitor the heartbeat when the fetus is at rest and while the fetus is moving. In a healthy fetus, there will be an increase in fetal heart rate when the fetus moves or kicks. The heart rate will decrease at rest. This test helps determine if the fetus is healthy. Your health care provider will look at a number of patterns in the heart rate tracing to make sure your baby is thriving. If there is concern, your health care provider may order additional tests or may suggest another course of action. This test is often done in the third trimester and can help determine if an early delivery is needed and safe. Common reasons to have this test are: °· You are past your due date. °· You have a high-risk pregnancy. °· You are feeling less movement than normal. °· You have lost a pregnancy in the past. °· Your health care provider suspects fetal growth problems. °· You have too much or too little amniotic fluid. °BEFORE THE PROCEDURE °· Eat a meal right before the test or as directed by your health care provider. Food may help stimulate fetal movements. °· Use the restroom right before the test. °PROCEDURE °· Two belts will be placed around your abdomen. These belts have monitors attached to them. One records the fetal heart rate and the other records uterine contractions. °· You may be asked to lie down on your side or to stay sitting upright. °· You may be given a button to press when you feel movement. °· The fetal heartbeat is listened to and watched on a screen. The heartbeat is recorded on a sheet of paper. °· If the fetus seems to be sleeping, you may be asked to drink some juice or soda, gently press your abdomen, or make some noise to wake the fetus. °AFTER THE PROCEDURE  °Your health care provider will discuss the test results with you and make recommendations for the near future. °Document Released: 05/12/2002  Document Revised: 10/06/2013 Document Reviewed: 06/25/2012 °ExitCare® Patient Information ©2015 [employer]. This information is not intended to replace advice given to you by your health care provider. Make sure you discuss any questions you have with your health care provider. ° °

## 2014-03-10 ENCOUNTER — Encounter (HOSPITAL_COMMUNITY): Payer: Self-pay | Admitting: *Deleted

## 2014-03-10 ENCOUNTER — Telehealth (HOSPITAL_COMMUNITY): Payer: Self-pay | Admitting: *Deleted

## 2014-03-10 NOTE — Telephone Encounter (Signed)
Preadmission screen  

## 2014-03-12 ENCOUNTER — Encounter (HOSPITAL_COMMUNITY): Payer: Self-pay

## 2014-03-12 ENCOUNTER — Encounter (HOSPITAL_COMMUNITY): Payer: Medicaid Other | Admitting: Anesthesiology

## 2014-03-12 ENCOUNTER — Inpatient Hospital Stay (HOSPITAL_COMMUNITY)
Admission: RE | Admit: 2014-03-12 | Discharge: 2014-03-13 | DRG: 775 | Disposition: A | Discharge status: Home / Self Care | Payer: Medicaid Other | Source: Ambulatory Visit | Attending: Obstetrics and Gynecology | Admitting: Obstetrics and Gynecology

## 2014-03-12 ENCOUNTER — Inpatient Hospital Stay (HOSPITAL_COMMUNITY): Payer: Medicaid Other | Admitting: Anesthesiology

## 2014-03-12 VITALS — BP 137/80 | HR 92 | Temp 98.1°F | Resp 16 | Ht 70.0 in | Wt 308.0 lb

## 2014-03-12 DIAGNOSIS — Z3A39 39 weeks gestation of pregnancy: Secondary | ICD-10-CM | POA: Diagnosis present

## 2014-03-12 DIAGNOSIS — Z6841 Body Mass Index (BMI) 40.0 and over, adult: Secondary | ICD-10-CM | POA: Diagnosis not present

## 2014-03-12 DIAGNOSIS — O99334 Smoking (tobacco) complicating childbirth: Secondary | ICD-10-CM | POA: Diagnosis present

## 2014-03-12 DIAGNOSIS — E039 Hypothyroidism, unspecified: Secondary | ICD-10-CM | POA: Diagnosis present

## 2014-03-12 DIAGNOSIS — O99284 Endocrine, nutritional and metabolic diseases complicating childbirth: Secondary | ICD-10-CM | POA: Diagnosis present

## 2014-03-12 DIAGNOSIS — O1213 Gestational proteinuria, third trimester: Secondary | ICD-10-CM | POA: Diagnosis present

## 2014-03-12 DIAGNOSIS — O99214 Obesity complicating childbirth: Secondary | ICD-10-CM | POA: Diagnosis present

## 2014-03-12 DIAGNOSIS — Z3490 Encounter for supervision of normal pregnancy, unspecified, unspecified trimester: Secondary | ICD-10-CM

## 2014-03-12 LAB — COMPREHENSIVE METABOLIC PANEL
ALT: 15 U/L (ref 0–35)
ANION GAP: 14 (ref 5–15)
AST: 13 U/L (ref 0–37)
Albumin: 2.5 g/dL — ABNORMAL LOW (ref 3.5–5.2)
Alkaline Phosphatase: 178 U/L — ABNORMAL HIGH (ref 39–117)
BUN: 8 mg/dL (ref 6–23)
CALCIUM: 8.6 mg/dL (ref 8.4–10.5)
CO2: 20 mEq/L (ref 19–32)
CREATININE: 0.5 mg/dL (ref 0.50–1.10)
Chloride: 102 mEq/L (ref 96–112)
GLUCOSE: 120 mg/dL — AB (ref 70–99)
Potassium: 3.9 mEq/L (ref 3.7–5.3)
SODIUM: 136 meq/L — AB (ref 137–147)
Total Bilirubin: <0.2 mg/dL — ABNORMAL LOW (ref 0.3–1.2)
Total Protein: 6.1 g/dL (ref 6.0–8.3)

## 2014-03-12 LAB — CBC
HCT: 32.8 % — ABNORMAL LOW (ref 36.0–46.0)
Hemoglobin: 11.1 g/dL — ABNORMAL LOW (ref 12.0–15.0)
MCH: 28.9 pg (ref 26.0–34.0)
MCHC: 33.8 g/dL (ref 30.0–36.0)
MCV: 85.4 fL (ref 78.0–100.0)
Platelets: 283 10*3/uL (ref 150–400)
RBC: 3.84 MIL/uL — AB (ref 3.87–5.11)
RDW: 13.1 % (ref 11.5–15.5)
WBC: 11.2 10*3/uL — AB (ref 4.0–10.5)

## 2014-03-12 LAB — URIC ACID: Uric Acid, Serum: 4.9 mg/dL (ref 2.4–7.0)

## 2014-03-12 LAB — RPR

## 2014-03-12 MED ORDER — EPHEDRINE 5 MG/ML INJ
10.0000 mg | INTRAVENOUS | Status: DC | PRN
  Filled 2014-03-12: qty 2

## 2014-03-12 MED ORDER — DIPHENHYDRAMINE HCL 50 MG/ML IJ SOLN: 12.5000 mg | INTRAMUSCULAR | Status: DC | PRN

## 2014-03-12 MED ORDER — OXYCODONE-ACETAMINOPHEN 5-325 MG PO TABS: 2.0000 | ORAL_TABLET | ORAL | Status: DC | PRN

## 2014-03-12 MED ORDER — LACTATED RINGERS IV SOLN
INTRAVENOUS | Status: DC
  Administered 2014-03-12 (×2): via INTRAVENOUS

## 2014-03-12 MED ORDER — LEVOTHYROXINE SODIUM 25 MCG PO TABS
25.0000 ug | ORAL_TABLET | Freq: Every day | ORAL | Status: DC
  Administered 2014-03-13: 25 ug via ORAL
  Filled 2014-03-12 (×2): qty 1

## 2014-03-12 MED ORDER — SIMETHICONE 80 MG PO CHEW: 80.0000 mg | CHEWABLE_TABLET | ORAL | Status: DC | PRN

## 2014-03-12 MED ORDER — FLEET ENEMA 7-19 GM/118ML RE ENEM: 1.0000 | ENEMA | RECTAL | Status: DC | PRN

## 2014-03-12 MED ORDER — IBUPROFEN 800 MG PO TABS
800.0000 mg | ORAL_TABLET | Freq: Three times a day (TID) | ORAL | Status: DC
  Administered 2014-03-12 – 2014-03-13 (×4): 800 mg via ORAL
  Filled 2014-03-12 (×4): qty 1

## 2014-03-12 MED ORDER — METHYLERGONOVINE MALEATE 0.2 MG/ML IJ SOLN: 0.2000 mg | INTRAMUSCULAR | Status: DC | PRN

## 2014-03-12 MED ORDER — PHENYLEPHRINE 40 MCG/ML (10ML) SYRINGE FOR IV PUSH (FOR BLOOD PRESSURE SUPPORT)
80.0000 ug | PREFILLED_SYRINGE | INTRAVENOUS | Status: DC | PRN
  Filled 2014-03-12: qty 2

## 2014-03-12 MED ORDER — PANTOPRAZOLE SODIUM 40 MG PO TBEC
40.0000 mg | DELAYED_RELEASE_TABLET | Freq: Every day | ORAL | Status: DC
  Administered 2014-03-13: 40 mg via ORAL
  Filled 2014-03-12: qty 1

## 2014-03-12 MED ORDER — PHENYLEPHRINE 40 MCG/ML (10ML) SYRINGE FOR IV PUSH (FOR BLOOD PRESSURE SUPPORT)
80.0000 ug | PREFILLED_SYRINGE | INTRAVENOUS | Status: DC | PRN
  Filled 2014-03-12: qty 10
  Filled 2014-03-12: qty 2

## 2014-03-12 MED ORDER — OXYTOCIN 40 UNITS IN LACTATED RINGERS INFUSION - SIMPLE MED: 62.5000 mL/h | INTRAVENOUS | Status: DC

## 2014-03-12 MED ORDER — OXYCODONE-ACETAMINOPHEN 5-325 MG PO TABS: 1.0000 | ORAL_TABLET | ORAL | Status: DC | PRN

## 2014-03-12 MED ORDER — BENZOCAINE-MENTHOL 20-0.5 % EX AERO
1.0000 "application " | INHALATION_SPRAY | CUTANEOUS | Status: DC | PRN
  Filled 2014-03-12: qty 56

## 2014-03-12 MED ORDER — TERBUTALINE SULFATE 1 MG/ML IJ SOLN: 0.2500 mg | Freq: Once | INTRAMUSCULAR | Status: DC | PRN

## 2014-03-12 MED ORDER — PRENATAL MULTIVITAMIN CH
1.0000 | ORAL_TABLET | Freq: Every day | ORAL | Status: DC
  Administered 2014-03-13: 1 via ORAL
  Filled 2014-03-12: qty 1

## 2014-03-12 MED ORDER — WITCH HAZEL-GLYCERIN EX PADS: 1.0000 "application " | MEDICATED_PAD | CUTANEOUS | Status: DC | PRN

## 2014-03-12 MED ORDER — LANOLIN HYDROUS EX OINT: TOPICAL_OINTMENT | CUTANEOUS | Status: DC | PRN

## 2014-03-12 MED ORDER — ONDANSETRON HCL 4 MG PO TABS: 4.0000 mg | ORAL_TABLET | ORAL | Status: DC | PRN

## 2014-03-12 MED ORDER — SODIUM CHLORIDE 0.9 % IJ SOLN: 3.0000 mL | INTRAMUSCULAR | Status: DC | PRN

## 2014-03-12 MED ORDER — DIPHENHYDRAMINE HCL 25 MG PO CAPS: 25.0000 mg | ORAL_CAPSULE | Freq: Four times a day (QID) | ORAL | Status: DC | PRN

## 2014-03-12 MED ORDER — LACTATED RINGERS IV SOLN: 500.0000 mL | Freq: Once | INTRAVENOUS | Status: DC

## 2014-03-12 MED ORDER — FENTANYL 2.5 MCG/ML BUPIVACAINE 1/10 % EPIDURAL INFUSION (WH - ANES)
14.0000 mL/h | INTRAMUSCULAR | Status: DC | PRN
  Administered 2014-03-12 (×2): 14 mL/h via EPIDURAL
  Filled 2014-03-12: qty 125

## 2014-03-12 MED ORDER — LACTATED RINGERS IV SOLN: 500.0000 mL | INTRAVENOUS | Status: DC | PRN

## 2014-03-12 MED ORDER — ZOLPIDEM TARTRATE 5 MG PO TABS: 5.0000 mg | ORAL_TABLET | Freq: Every evening | ORAL | Status: DC | PRN

## 2014-03-12 MED ORDER — OXYCODONE-ACETAMINOPHEN 5-325 MG PO TABS
1.0000 | ORAL_TABLET | ORAL | Status: DC | PRN
  Filled 2014-03-12 (×2): qty 1

## 2014-03-12 MED ORDER — FERROUS SULFATE 325 (65 FE) MG PO TABS
325.0000 mg | ORAL_TABLET | Freq: Two times a day (BID) | ORAL | Status: DC
Start: 2014-03-13 — End: 2014-03-14
  Administered 2014-03-13: 325 mg via ORAL
  Filled 2014-03-12: qty 1

## 2014-03-12 MED ORDER — SENNOSIDES-DOCUSATE SODIUM 8.6-50 MG PO TABS
2.0000 | ORAL_TABLET | ORAL | Status: DC
  Administered 2014-03-13: 2 via ORAL
  Filled 2014-03-12: qty 2

## 2014-03-12 MED ORDER — LIDOCAINE HCL (PF) 1 % IJ SOLN
30.0000 mL | INTRAMUSCULAR | Status: DC | PRN
  Filled 2014-03-12: qty 30

## 2014-03-12 MED ORDER — CITRIC ACID-SODIUM CITRATE 334-500 MG/5ML PO SOLN: 30.0000 mL | ORAL | Status: DC | PRN

## 2014-03-12 MED ORDER — ACETAMINOPHEN 325 MG PO TABS: 650.0000 mg | ORAL_TABLET | ORAL | Status: DC | PRN

## 2014-03-12 MED ORDER — ONDANSETRON HCL 4 MG/2ML IJ SOLN: 4.0000 mg | INTRAMUSCULAR | Status: DC | PRN

## 2014-03-12 MED ORDER — ONDANSETRON HCL 4 MG/2ML IJ SOLN
4.0000 mg | Freq: Four times a day (QID) | INTRAMUSCULAR | Status: DC | PRN
  Administered 2014-03-12: 4 mg via INTRAVENOUS
  Filled 2014-03-12: qty 2

## 2014-03-12 MED ORDER — MAGNESIUM HYDROXIDE 400 MG/5ML PO SUSP: 30.0000 mL | ORAL | Status: DC | PRN

## 2014-03-12 MED ORDER — OXYTOCIN BOLUS FROM INFUSION: 500.0000 mL | INTRAVENOUS | Status: DC

## 2014-03-12 MED ORDER — LIDOCAINE HCL (PF) 1 % IJ SOLN
INTRAMUSCULAR | Status: DC | PRN
  Administered 2014-03-12 (×2): 5 mL

## 2014-03-12 MED ORDER — OMEPRAZOLE MAGNESIUM 20 MG PO TBEC: 20.0000 mg | DELAYED_RELEASE_TABLET | Freq: Every day | ORAL | Status: DC

## 2014-03-12 MED ORDER — SODIUM CHLORIDE 0.9 % IV SOLN: 250.0000 mL | INTRAVENOUS | Status: DC | PRN

## 2014-03-12 MED ORDER — SODIUM CHLORIDE 0.9 % IJ SOLN: 3.0000 mL | Freq: Two times a day (BID) | INTRAMUSCULAR | Status: DC

## 2014-03-12 MED ORDER — MEASLES, MUMPS & RUBELLA VAC ~~LOC~~ INJ
0.5000 mL | INJECTION | Freq: Once | SUBCUTANEOUS | Status: DC
  Filled 2014-03-12: qty 0.5

## 2014-03-12 MED ORDER — DIBUCAINE 1 % RE OINT: 1.0000 "application " | TOPICAL_OINTMENT | RECTAL | Status: DC | PRN

## 2014-03-12 MED ORDER — BUTALBITAL-APAP-CAFFEINE 50-325-40 MG PO TABS: 1.0000 | ORAL_TABLET | Freq: Four times a day (QID) | ORAL | Status: DC | PRN

## 2014-03-12 MED ORDER — TETANUS-DIPHTH-ACELL PERTUSSIS 5-2.5-18.5 LF-MCG/0.5 IM SUSP
0.5000 mL | Freq: Once | INTRAMUSCULAR | Status: AC
  Administered 2014-03-13: 0.5 mL via INTRAMUSCULAR
  Filled 2014-03-12: qty 0.5

## 2014-03-12 MED ORDER — OXYTOCIN 40 UNITS IN LACTATED RINGERS INFUSION - SIMPLE MED
1.0000 m[IU]/min | INTRAVENOUS | Status: DC
  Administered 2014-03-12: 2 m[IU]/min via INTRAVENOUS
  Administered 2014-03-12: 666 m[IU]/min via INTRAVENOUS
  Filled 2014-03-12: qty 1000

## 2014-03-12 MED ORDER — METHYLERGONOVINE MALEATE 0.2 MG PO TABS: 0.2000 mg | ORAL_TABLET | ORAL | Status: DC | PRN

## 2014-03-12 NOTE — Anesthesia Procedure Notes (Signed)
Epidural Patient location during procedure: OB Start time: 03/12/2014 11:47 AM  Staffing Anesthesiologist: Brayton CavesJACKSON, Vyron Fronczak Performed by: anesthesiologist   Preanesthetic Checklist Completed: patient identified, site marked, surgical consent, pre-op evaluation, timeout performed, IV checked, risks and benefits discussed and monitors and equipment checked  Epidural Patient position: sitting Prep: site prepped and draped and DuraPrep Patient monitoring: continuous pulse ox and blood pressure Approach: midline Location: L3-L4 Injection technique: LOR air  Needle:  Needle type: Tuohy  Needle gauge: 17 G Needle length: 9 cm and 9 Needle insertion depth: 8 cm Catheter type: closed end flexible Catheter size: 19 Gauge Catheter at skin depth: 13 cm Test dose: negative  Assessment Events: blood not aspirated, injection not painful, no injection resistance, negative IV test and no paresthesia  Additional Notes Patient identified.  Risk benefits discussed including failed block, incomplete pain control, headache, nerve damage, paralysis, blood pressure changes, nausea, vomiting, reactions to medication both toxic or allergic, and postpartum back pain.  Patient expressed understanding and wished to proceed.  All questions were answered.  Sterile technique used throughout procedure and epidural site dressed with sterile barrier dressing. No paresthesia or other complications noted.The patient did not experience any signs of intravascular injection such as tinnitus or metallic taste in mouth nor signs of intrathecal spread such as rapid motor block. Please see nursing notes for vital signs.

## 2014-03-12 NOTE — Anesthesia Preprocedure Evaluation (Signed)
Anesthesia Evaluation  Patient identified by MRN, date of birth, ID band Patient awake    Reviewed: Allergy & Precautions, H&P , Patient's Chart, lab work & pertinent test results  Airway Mallampati: III TM Distance: >3 FB Neck ROM: full    Dental   Pulmonary Current Smoker,  breath sounds clear to auscultation        Cardiovascular Rhythm:regular Rate:Normal     Neuro/Psych  Headaches,    GI/Hepatic   Endo/Other  Hypothyroidism Morbid obesity  Renal/GU      Musculoskeletal   Abdominal   Peds  Hematology   Anesthesia Other Findings   Reproductive/Obstetrics (+) Pregnancy                           Anesthesia Physical Anesthesia Plan  ASA: III  Anesthesia Plan: Epidural   Post-op Pain Management:    Induction:   Airway Management Planned:   Additional Equipment:   Intra-op Plan:   Post-operative Plan:   Informed Consent: I have reviewed the patients History and Physical, chart, labs and discussed the procedure including the risks, benefits and alternatives for the proposed anesthesia with the patient or authorized representative who has indicated his/her understanding and acceptance.     Plan Discussed with:   Anesthesia Plan Comments:         Anesthesia Quick Evaluation
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
## 2014-03-12 NOTE — Plan of Care (Signed)
Problem: Phase I Progression Outcomes Goal: Medical plan of care initiated within 2 hrs of admission Outcome: Progressing Pt with proteinuria and followed in office with several 24 hour urines in office. CMET and uric acid drawn on admission.

## 2014-03-13 LAB — CBC
HCT: 28.6 % — ABNORMAL LOW (ref 36.0–46.0)
Hemoglobin: 9.7 g/dL — ABNORMAL LOW (ref 12.0–15.0)
MCH: 29.1 pg (ref 26.0–34.0)
MCHC: 33.9 g/dL (ref 30.0–36.0)
MCV: 85.9 fL (ref 78.0–100.0)
Platelets: 277 10*3/uL (ref 150–400)
RBC: 3.33 MIL/uL — AB (ref 3.87–5.11)
RDW: 13.1 % (ref 11.5–15.5)
WBC: 13.3 10*3/uL — ABNORMAL HIGH (ref 4.0–10.5)

## 2014-03-13 MED ORDER — INFLUENZA VAC SPLIT QUAD 0.5 ML IM SUSY
0.5000 mL | PREFILLED_SYRINGE | Freq: Once | INTRAMUSCULAR | Status: AC
  Administered 2014-03-13: 0.5 mL via INTRAMUSCULAR
  Filled 2014-03-13: qty 0.5

## 2014-03-13 NOTE — Discharge Summary (Signed)
Obstetric Discharge Summary Reason for Admission: induction of labor Prenatal Procedures: NST and ultrasound Intrapartum Procedures: spontaneous vaginal delivery Postpartum Procedures: none Complications-Operative and Postpartum: shoulder dystocia Hemoglobin  Date Value Ref Range Status  03/13/2014 9.7* 12.0 - 15.0 g/dL Final     HCT  Date Value Ref Range Status  03/13/2014 28.6* 36.0 - 46.0 % Final    Physical Exam:  General: alert Lochia: appropriate Uterine Fundus: firm   Discharge Diagnoses: Term Pregnancy-delivered and proteinura.  Discharge Information: Date: 03/13/2014 Activity: pelvic rest Diet: routine Medications: PNV and Ibuprofen Condition: stable Instructions: refer to practice specific booklet Discharge to: home Follow-up Information   Follow up with HORVATH,MICHELLE A, MD. Schedule an appointment as soon as possible for a visit in 1 month.   Specialty:  Obstetrics and Gynecology   Contact information:   [address] [place] [phone]       Newborn Data: Live born female  Birth Weight: 10 lb 3.1 oz (4624 g) APGAR: 8, 9  Home with mother.  ANDERSON,MARK E 03/13/2014, 8:13 PM

## 2014-03-13 NOTE — Lactation Note (Signed)
This note was copied from the chart of Kelly Marcelle OverlieKalynn Talton. Lactation Consultation Note Called back to rm. By RN for assistance in BF d/t painful latching. Mom grimacing stating its hurting so bad she couldn't stand it. Has 2 small blisters on Rt. Nipple. I assessed nipples and didn't notice them, mom stated she just got them w/this feeding. Mom stated it was painful w/her first child and she was miserable and then had a low milk supply. Mom stated she pumped and it seemed to be a lot of trouble. Mom states "I may change to bottle feeding, but I wanted her to get my breast milk, but I can't stand this pain." I mentioned to pt. That she didn't tell me when I was in earlier about the feeding being so painful, that she told me the baby was latching well. I would help her do what ever she wished to do. Mom had the choice of what she wanted to do, yes BF is best for the baby, mom could always pump and bottle feed breast milk if she desired. Mom stated yes I want to do that. I had given mom hand pump to post pump to stimulate breast. DEBP are not available at this time. RN would try to give her one when one comes available. Encouraged to use manual pump until then. Mom was ok with that. Baby fussy, mom requested formula d/t had a drop of colostrum and baby is large. Encouraged to hand express as well d/t may get more colostrum than pumping. Formula given, w/feeding chart according to hours of age by RN. Patient Name: Kelly Kim ZOXWR'UToday's Date: 03/13/2014 Reason for consult: Initial assessment   Maternal Data Has patient been taught Hand Expression?: Yes Does the patient have breastfeeding experience prior to this delivery?: Yes  Feeding Feeding Type: Breast Fed  LATCH Score/Interventions                      Lactation Tools Discussed/Used Pump Review: Setup, frequency, and cleaning;Milk Storage Initiated by:: Peri JeffersonL. Huber Mathers RN Date initiated:: 03/13/14   Consult Status Consult Status:  Follow-up Date: 03/13/14 Follow-up type: In-patient    Tela Kotecki, Diamond NickelLAURA G 03/13/2014, 7:05 AM

## 2014-03-13 NOTE — Progress Notes (Signed)
Pt doing well. Would like to go home. Baby is doing well.

## 2014-03-13 NOTE — Lactation Note (Signed)
This note was copied from the chart of Kelly Kim. Lactation Consultation Note Mom had 10.3 lb. Baby Kelly vag. Discussed possibility of jaundice and BF will help baby poop and lower bili levels some. Mom BF 1st child for 6 weeks and stopped d/t low milk supply. Noted breast tubular pendulum shaped, good amount of breast tissue noted, nipples pointing at bottom of breast. Encouraged to maybe elevate breast during BF w/dry wash cloth for support. Mom demonstrated hand expression w/gushed out clear colostrum noted. Has good everted nipples. Lt. More than Rt. But able to obtain a good latch. Asked mom if she had PCOS and had never heard of it. 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 Explained that if she did have it we would want her to post pump after BF to build up milk supply. Mom going to discuss w/Dr. to check her for that d/t she feels that she has a lot of s/sx. Mom encouraged to feed baby 8-12 times/24 hours and with feeding cues. Mom encouraged to waken baby for feeds. Referred to Baby [employer] Me [employer] in Breastfeeding section Pg. 22-23 for position options and Proper latch demonstration. Educated about newborn behavior. Mom reports + breast changes w/pregnancy. Encouraged comfort during BF so colostrum flows better and mom will enjoy the feeding longer. Taking deep breaths and breast massage during BF. Mom encouraged to do skin-to-skin. WH/LC brochure given w/resources, support groups and LC services. Patient Name: Kelly Marcelle OverlieKalynn Rodkey ZOXWR'UToday's Date: 03/13/2014 Reason for consult: Initial assessment   Maternal Data Has patient been taught Hand Expression?: Yes Does the patient have breastfeeding experience prior to this delivery?: Yes  Feeding    LATCH Score/Interventions                      Lactation Tools Discussed/Used Pump Review: Setup, frequency, and cleaning;Milk Storage Initiated by:: Peri JeffersonL. Amandalee Lacap RN Date  initiated:: 03/13/14   Consult Status Consult Status: Follow-up Date: 03/13/14 Follow-up type: In-patient    Alexxander Kurt, Diamond NickelLAURA G 03/13/2014, 6:12 AM

## 2014-03-13 NOTE — Anesthesia Postprocedure Evaluation (Signed)
  Anesthesia Post-op Note  Patient: Kelly ParkinsonKalynn D Vroom  Procedure(s) Performed: * No procedures listed *  Patient Location: PACU  Anesthesia Type:Epidural  Level of Consciousness: awake  Airway and Oxygen Therapy: Patient Spontanous Breathing  Post-op Pain: none  Post-op Assessment: Post-op Vital signs reviewed, Patient's Cardiovascular Status Stable, PATIENT'S CARDIOVASCULAR STATUS UNSTABLE, Patent Airway, No signs of Nausea or vomiting, Adequate PO intake, Pain level controlled and No headache  Post-op Vital Signs: Reviewed  Last Vitals:  Filed Vitals:   03/13/14 0039  BP: 120/80  Pulse: 89  Temp: 36.7 C  Resp: 16    Complications: No apparent anesthesia complications

## 2014-03-15 LAB — TYPE AND SCREEN
ABO/RH(D): B NEG
ANTIBODY SCREEN: POSITIVE
DAT, IgG: NEGATIVE
Unit division: 0
Unit division: 0

## 2014-04-06 ENCOUNTER — Encounter (HOSPITAL_COMMUNITY): Payer: Self-pay

## 2014-11-29 ENCOUNTER — Encounter (HOSPITAL_COMMUNITY): Payer: Self-pay | Admitting: Emergency Medicine

## 2014-11-29 ENCOUNTER — Emergency Department (HOSPITAL_COMMUNITY)
Admission: EM | Admit: 2014-11-29 | Discharge: 2014-11-29 | Disposition: A | Discharge status: Home / Self Care | Payer: Self-pay | Attending: Emergency Medicine | Admitting: Emergency Medicine

## 2014-11-29 DIAGNOSIS — Y92511 Restaurant or cafe as the place of occurrence of the external cause: Secondary | ICD-10-CM | POA: Insufficient documentation

## 2014-11-29 DIAGNOSIS — A059 Bacterial foodborne intoxication, unspecified: Secondary | ICD-10-CM

## 2014-11-29 DIAGNOSIS — E079 Disorder of thyroid, unspecified: Secondary | ICD-10-CM | POA: Insufficient documentation

## 2014-11-29 DIAGNOSIS — R112 Nausea with vomiting, unspecified: Secondary | ICD-10-CM

## 2014-11-29 DIAGNOSIS — R21 Rash and other nonspecific skin eruption: Secondary | ICD-10-CM

## 2014-11-29 DIAGNOSIS — Z79899 Other long term (current) drug therapy: Secondary | ICD-10-CM | POA: Insufficient documentation

## 2014-11-29 DIAGNOSIS — Y939 Activity, unspecified: Secondary | ICD-10-CM | POA: Insufficient documentation

## 2014-11-29 DIAGNOSIS — T6291XA Toxic effect of unspecified noxious substance eaten as food, accidental (unintentional), initial encounter: Secondary | ICD-10-CM | POA: Insufficient documentation

## 2014-11-29 DIAGNOSIS — R197 Diarrhea, unspecified: Secondary | ICD-10-CM

## 2014-11-29 DIAGNOSIS — Y999 Unspecified external cause status: Secondary | ICD-10-CM | POA: Insufficient documentation

## 2014-11-29 HISTORY — DX: Disorder of thyroid, unspecified: E07.9

## 2014-11-29 LAB — CBC WITH DIFFERENTIAL/PLATELET
Basophils Absolute: 0 10*3/uL (ref 0.0–0.1)
Basophils Relative: 0 % (ref 0–1)
EOS ABS: 0.2 10*3/uL (ref 0.0–0.7)
Eosinophils Relative: 1 % (ref 0–5)
HCT: 48.4 % — ABNORMAL HIGH (ref 36.0–46.0)
Hemoglobin: 15.9 g/dL — ABNORMAL HIGH (ref 12.0–15.0)
LYMPHS PCT: 13 % (ref 12–46)
Lymphs Abs: 2.3 10*3/uL (ref 0.7–4.0)
MCH: 28.2 pg (ref 26.0–34.0)
MCHC: 32.9 g/dL (ref 30.0–36.0)
MCV: 85.8 fL (ref 78.0–100.0)
MONOS PCT: 3 % (ref 3–12)
Monocytes Absolute: 0.5 10*3/uL (ref 0.1–1.0)
NEUTROS ABS: 14.1 10*3/uL — AB (ref 1.7–7.7)
Neutrophils Relative %: 83 % — ABNORMAL HIGH (ref 43–77)
PLATELETS: 306 10*3/uL (ref 150–400)
RBC: 5.64 MIL/uL — ABNORMAL HIGH (ref 3.87–5.11)
RDW: 13.5 % (ref 11.5–15.5)
WBC: 17 10*3/uL — ABNORMAL HIGH (ref 4.0–10.5)

## 2014-11-29 LAB — COMPREHENSIVE METABOLIC PANEL
ALT: 18 U/L (ref 14–54)
AST: 18 U/L (ref 15–41)
Albumin: 3.6 g/dL (ref 3.5–5.0)
Alkaline Phosphatase: 63 U/L (ref 38–126)
Anion gap: 10 (ref 5–15)
BUN: 22 mg/dL — ABNORMAL HIGH (ref 6–20)
CO2: 22 mmol/L (ref 22–32)
CREATININE: 0.82 mg/dL (ref 0.44–1.00)
Calcium: 8.6 mg/dL — ABNORMAL LOW (ref 8.9–10.3)
Chloride: 108 mmol/L (ref 101–111)
GFR calc Af Amer: >60 mL/min (ref >60)
GFR calc non Af Amer: >60 mL/min (ref >60)
Glucose, Bld: 141 mg/dL — ABNORMAL HIGH (ref 65–99)
Potassium: 3 mmol/L — ABNORMAL LOW (ref 3.5–5.1)
Sodium: 140 mmol/L (ref 135–145)
Total Bilirubin: 0.3 mg/dL (ref 0.3–1.2)
Total Protein: 6.1 g/dL — ABNORMAL LOW (ref 6.5–8.1)

## 2014-11-29 LAB — URINALYSIS, ROUTINE W REFLEX MICROSCOPIC
GLUCOSE, UA: NEGATIVE mg/dL
Hgb urine dipstick: NEGATIVE
Ketones, ur: NEGATIVE mg/dL
NITRITE: NEGATIVE
Protein, ur: 100 mg/dL — AB
Specific Gravity, Urine: 1.041 — ABNORMAL HIGH (ref 1.005–1.030)
Urobilinogen, UA: 0.2 mg/dL (ref 0.0–1.0)
pH: 5.5 (ref 5.0–8.0)

## 2014-11-29 LAB — URINE MICROSCOPIC-ADD ON

## 2014-11-29 MED ORDER — DEXAMETHASONE SODIUM PHOSPHATE 10 MG/ML IJ SOLN
10.0000 mg | Freq: Once | INTRAMUSCULAR | Status: AC
  Administered 2014-11-29: 10 mg via INTRAVENOUS
  Filled 2014-11-29: qty 1

## 2014-11-29 MED ORDER — ONDANSETRON HCL 4 MG/2ML IJ SOLN: 4.0000 mg | Freq: Once | INTRAMUSCULAR | Status: DC

## 2014-11-29 MED ORDER — DICYCLOMINE HCL 20 MG PO TABS
20.0000 mg | ORAL_TABLET | Freq: Two times a day (BID) | ORAL | Status: DC
Start: 2014-11-29 — End: 2015-09-03

## 2014-11-29 MED ORDER — PROMETHAZINE HCL 25 MG/ML IJ SOLN
12.5000 mg | Freq: Once | INTRAMUSCULAR | Status: AC
  Administered 2014-11-29: 12.5 mg via INTRAVENOUS
  Filled 2014-11-29 (×2): qty 1

## 2014-11-29 MED ORDER — MORPHINE SULFATE 4 MG/ML IJ SOLN
4.0000 mg | Freq: Once | INTRAMUSCULAR | Status: AC
  Administered 2014-11-29: 4 mg via INTRAVENOUS
  Filled 2014-11-29: qty 1

## 2014-11-29 MED ORDER — HYOSCYAMINE SULFATE 0.5 MG/ML IJ SOLN
0.1250 mg | Freq: Once | INTRAMUSCULAR | Status: AC
  Administered 2014-11-29: 0.125 mg via INTRAVENOUS
  Filled 2014-11-29: qty 0.25

## 2014-11-29 MED ORDER — EPINEPHRINE 0.3 MG/0.3ML IJ SOAJ: 0.3000 mg | Freq: Once | INTRAMUSCULAR | Status: DC | PRN

## 2014-11-29 MED ORDER — PROMETHAZINE HCL 25 MG PO TABS: 25.0000 mg | ORAL_TABLET | Freq: Four times a day (QID) | ORAL | Status: DC | PRN

## 2014-11-29 MED ORDER — FAMOTIDINE IN NACL 20-0.9 MG/50ML-% IV SOLN
20.0000 mg | Freq: Once | INTRAVENOUS | Status: AC
  Administered 2014-11-29: 20 mg via INTRAVENOUS
  Filled 2014-11-29: qty 50

## 2014-11-29 NOTE — ED Notes (Signed)
RN is drawing [phone] labs.

## 2014-11-29 NOTE — ED Provider Notes (Signed)
Patient care assumed from Elpidio Anis, PA-C at shift change. Please see her note for further.  Briefly, the patient woke up early this am with nausea, vomiting, crampy abdominal pain and diarrhea as well as a generalized erythematous rash with itching. Symptoms seem most consistent with a reaction to food the virus could also produce similar symptoms. Plan was for symptomatic treatment and waiting on UA.  On my exam the patient is afebrile and nontoxic appearing. Her rash has completely resolved on my examination and she reports her itching has resolved. Patient is still complaining of some cramping abdominal pain but denies further diarrhea. She reports her nausea has resolved and she is tolerated ice chips and water ED with vomiting. Her urinalysis returned with trace leukocytes and many squamous epithelial cells. Patient has no urinary symptoms and therefore will not treat this urinalysis. After discussing with my attending Dr. Read Drivers, will give decadron 10 mg IV for the rash and will send home with phenergan and bentyl. I advised the patient she could use Imodium later today if her diarrhea persisted. Education on [employer] Strict return precautions provided. I advised the patient to follow-up with their primary care provider this week. I advised the patient to return to the emergency department with new or worsening symptoms or new concerns. The patient verbalized understanding and agreement with plan.    This patient was discussed with and evaluated by Dr. Read Drivers who agrees with assessment and plan.   Results for orders placed or performed during the hospital encounter of 11/29/14  Comprehensive metabolic panel  Result Value Ref Range   Sodium 140 135 - 145 mmol/L   Potassium 3.0 (L) 3.5 - 5.1 mmol/L   Chloride 108 101 - 111 mmol/L   CO2 22 22 - 32 mmol/L   Glucose, Bld 141 (H) 65 - 99 mg/dL   BUN 22 (H) 6 - 20 mg/dL   Creatinine, Ser 4.23 0.44 - 1.00 mg/dL   Calcium 8.6 (L) 8.9 - 10.3 mg/dL    Total Protein 6.1 (L) 6.5 - 8.1 g/dL   Albumin 3.6 3.5 - 5.0 g/dL   AST 18 15 - 41 U/L   ALT 18 14 - 54 U/L   Alkaline Phosphatase 63 38 - 126 U/L   Total Bilirubin 0.3 0.3 - 1.2 mg/dL   GFR calc non Af Amer >60 >60 mL/min   GFR calc Af Amer >60 >60 mL/min   Anion gap 10 5 - 15  CBC with Differential  Result Value Ref Range   WBC 17.0 (H) 4.0 - 10.5 K/uL   RBC 5.64 (H) 3.87 - 5.11 MIL/uL   Hemoglobin 15.9 (H) 12.0 - 15.0 g/dL   HCT 53.6 (H) 14.4 - 31.5 %   MCV 85.8 78.0 - 100.0 fL   MCH 28.2 26.0 - 34.0 pg   MCHC 32.9 30.0 - 36.0 g/dL   RDW 40.0 86.7 - 61.9 %   Platelets 306 150 - 400 K/uL   Neutrophils Relative % 83 (H) 43 - 77 %   Neutro Abs 14.1 (H) 1.7 - 7.7 K/uL   Lymphocytes Relative 13 12 - 46 %   Lymphs Abs 2.3 0.7 - 4.0 K/uL   Monocytes Relative 3 3 - 12 %   Monocytes Absolute 0.5 0.1 - 1.0 K/uL   Eosinophils Relative 1 0 - 5 %   Eosinophils Absolute 0.2 0.0 - 0.7 K/uL   Basophils Relative 0 0 - 1 %   Basophils Absolute 0.0 0.0 - 0.1  K/uL  Urinalysis, Routine w reflex microscopic (not at [facility])  Result Value Ref Range   Color, Urine AMBER (A) YELLOW   APPearance CLOUDY (A) CLEAR   Specific Gravity, Urine 1.041 (H) 1.005 - 1.030   pH 5.5 5.0 - 8.0   Glucose, UA NEGATIVE NEGATIVE mg/dL   Hgb urine dipstick NEGATIVE NEGATIVE   Bilirubin Urine SMALL (A) NEGATIVE   Ketones, ur NEGATIVE NEGATIVE mg/dL   Protein, ur 308 (A) NEGATIVE mg/dL   Urobilinogen, UA 0.2 0.0 - 1.0 mg/dL   Nitrite NEGATIVE NEGATIVE   Leukocytes, UA TRACE (A) NEGATIVE  Urine microscopic-add on  Result Value Ref Range   Squamous Epithelial / LPF MANY (A) RARE   WBC, UA 7-10 <3 WBC/hpf   Bacteria, UA FEW (A) RARE   No results found.    Everlene Farrier, PA-C 11/29/14 [id number]

## 2014-11-29 NOTE — ED Notes (Signed)
Bed: WA16 Expected date:  Expected time:  Means of arrival:  Comments: ems 

## 2014-11-29 NOTE — ED Notes (Addendum)
Per EMS-patient went to eat at local restaurant last night (ordered the same food as usual). Went home, had sexual intercourse with husband, went to sleep. Woke up this morning around 0230 with abdominal pain and diarrhea. Denies any new use of substances (laundry detergent, etc). Endorses marijuana use last night ("I have my own little stash and I know who I buy it from"). Denies new condom use-husband recently had a vasectomy. New occurences include going to work with her husband yesterday and being around the work site. Husband recently but up new baby gates, mirrors, curtains in the house they moved into approximately a month ago. Unable to pinpoint anything new/different yesterday or this morning. RR even/unlabored. Denies throat/oral swelling. Speaking full/clear sentences. Ambulatory with steady gait. Took unknown amount of Benadryl at 0330.

## 2014-11-29 NOTE — ED Provider Notes (Signed)
CSN: [id number]     Arrival date & time 11/29/14  0424 History   First MD Initiated Contact with Patient 11/29/14 [phone]     Chief Complaint  Patient presents with  . Allergic Reaction     (Consider location/radiation/quality/duration/timing/severity/associated sxs/prior Treatment) Patient is a 29 y.o. female presenting with allergic reaction and abdominal pain. The history is provided by the patient. No language interpreter was used.  Allergic Reaction Presenting symptoms: itching and rash   Presenting symptoms: no difficulty breathing and no difficulty swallowing   Severity:  Moderate Abdominal Pain Pain location:  Generalized (Left greater than right.) Pain quality: cramping   Pain severity:  Moderate Associated symptoms: diarrhea, nausea and vomiting   Associated symptoms: no chills and no fever   Associated symptoms comment:  Abdominal pain associated with nausea and vomiting that started last night. No fever. No urinary symptoms. She was woken up by the need to go to the bathroom and while sitting on the toilet started having generalized itching. She subsequently had N, V, diarrhea and abdominal cramping pain.    Past Medical History  Diagnosis Date  . Thyroid disease    History reviewed. No pertinent past surgical history. History reviewed. No pertinent family history. History  Substance Use Topics  . Smoking status: Never Smoker   . Smokeless tobacco: Not on file  . Alcohol Use: Yes   OB History    No data available     Review of Systems  Constitutional: Negative for fever and chills.  HENT: Negative for trouble swallowing.   Respiratory: Negative.   Cardiovascular: Negative.   Gastrointestinal: Positive for nausea, vomiting, abdominal pain and diarrhea.  Musculoskeletal: Negative.   Skin: Positive for itching and rash.  Neurological: Negative.       Allergies  Review of patient's allergies indicates no known allergies.  Home Medications   Prior to  Admission medications   Medication Sig Start Date End Date Taking? Authorizing Provider  levothyroxine (SYNTHROID, LEVOTHROID) 25 MCG tablet Take 25 mcg by mouth daily before breakfast.   Yes Historical Provider, MD  Multiple Vitamin (MULTIVITAMIN WITH MINERALS) TABS tablet Take 1 tablet by mouth daily.   Yes Historical Provider, MD   BP 103/66 mmHg  Pulse 92  Temp(Src) 97.6 F (36.4 C) (Oral)  Resp 16  SpO2 100%  LMP 11/18/2014 (Approximate) Physical Exam  Constitutional: She is oriented to person, place, and time. She appears well-developed and well-nourished.  HENT:  Head: Normocephalic.  Eyes: Conjunctivae are normal.  Neck: Normal range of motion. Neck supple.  Cardiovascular: Normal rate and regular rhythm.   Pulmonary/Chest: Effort normal and breath sounds normal.  Abdominal: Soft. Bowel sounds are normal. There is tenderness. There is no rebound and no guarding.  Diffuse abdominal tenderness.   Musculoskeletal: Normal range of motion.  Neurological: She is alert and oriented to person, place, and time.  Skin: Skin is warm and dry. No rash noted.  Psychiatric: She has a normal mood and affect.    ED Course  Procedures (including critical care time) Labs Review Labs Reviewed  COMPREHENSIVE METABOLIC PANEL  CBC WITH DIFFERENTIAL/PLATELET  URINALYSIS, ROUTINE W REFLEX MICROSCOPIC (NOT AT [facility])   Results for orders placed or performed during the hospital encounter of 11/29/14  CBC with Differential  Result Value Ref Range   WBC 17.0 (H) 4.0 - 10.5 K/uL   RBC 5.64 (H) 3.87 - 5.11 MIL/uL   Hemoglobin 15.9 (H) 12.0 - 15.0 g/dL   HCT 94.5 (H) 85.9 -  46.0 %   MCV 85.8 78.0 - 100.0 fL   MCH 28.2 26.0 - 34.0 pg   MCHC 32.9 30.0 - 36.0 g/dL   RDW 16.1 09.6 - 04.5 %   Platelets 306 150 - 400 K/uL   Neutrophils Relative % 83 (H) 43 - 77 %   Neutro Abs 14.1 (H) 1.7 - 7.7 K/uL   Lymphocytes Relative 13 12 - 46 %   Lymphs Abs 2.3 0.7 - 4.0 K/uL   Monocytes Relative 3 3 - 12  %   Monocytes Absolute 0.5 0.1 - 1.0 K/uL   Eosinophils Relative 1 0 - 5 %   Eosinophils Absolute 0.2 0.0 - 0.7 K/uL   Basophils Relative 0 0 - 1 %   Basophils Absolute 0.0 0.0 - 0.1 K/uL    Imaging Review No results found.   EKG Interpretation None      MDM   Final diagnoses:  None    1. Food poisoning  Patient care transferred to Will Dansy, PA-C, pending lab results and re-evaluation.   Symptoms, including rash, likely secondary to food borne exposure requiring symptomatic treatment.     Elpidio Anis, PA-C 11/29/14 [id number]  Paula Libra, MD 11/29/14 [phone]

## 2014-11-29 NOTE — Discharge Instructions (Signed)
Nausea and Vomiting Nausea is a sick feeling that often comes before throwing up (vomiting). Vomiting is a reflex where stomach contents come out of your mouth. Vomiting can cause severe loss of body fluids (dehydration). Children and elderly adults can become dehydrated quickly, especially if they also have diarrhea. Nausea and vomiting are symptoms of a condition or disease. It is important to find the cause of your symptoms. CAUSES   Direct irritation of the stomach lining. This irritation can result from increased acid production (gastroesophageal reflux disease), infection, food poisoning, taking certain medicines (such as nonsteroidal anti-inflammatory drugs), alcohol use, or tobacco use.  Signals from the brain.These signals could be caused by a headache, heat exposure, an inner ear disturbance, increased pressure in the brain from injury, infection, a tumor, or a concussion, pain, emotional stimulus, or metabolic problems.  An obstruction in the gastrointestinal tract (bowel obstruction).  Illnesses such as diabetes, hepatitis, gallbladder problems, appendicitis, kidney problems, cancer, sepsis, atypical symptoms of a heart attack, or eating disorders.  Medical treatments such as chemotherapy and radiation.  Receiving medicine that makes you sleep (general anesthetic) during surgery. DIAGNOSIS Your caregiver may ask for tests to be done if the problems do not improve after a few days. Tests may also be done if symptoms are severe or if the reason for the nausea and vomiting is not clear. Tests may include:  Urine tests.  Blood tests.  Stool tests.  Cultures (to look for evidence of infection).  X-rays or other imaging studies. Test results can help your caregiver make decisions about treatment or the need for additional tests. TREATMENT You need to stay well hydrated. Drink frequently but in small amounts.You may wish to drink water, sports drinks, clear broth, or eat frozen  ice pops or gelatin dessert to help stay hydrated.When you eat, eating slowly may help prevent nausea.There are also some antinausea medicines that may help prevent nausea. HOME CARE INSTRUCTIONS   Take all medicine as directed by your caregiver.  If you do not have an appetite, do not force yourself to eat. However, you must continue to drink fluids.  If you have an appetite, eat a normal diet unless your caregiver tells you differently.  Eat a variety of complex carbohydrates (rice, wheat, potatoes, bread), lean meats, yogurt, fruits, and vegetables.  Avoid high-fat foods because they are more difficult to digest.  Drink enough water and fluids to keep your urine clear or pale yellow.  If you are dehydrated, ask your caregiver for specific rehydration instructions. Signs of dehydration may include:  Severe thirst.  Dry lips and mouth.  Dizziness.  Dark urine.  Decreasing urine frequency and amount.  Confusion.  Rapid breathing or pulse. SEEK IMMEDIATE MEDICAL CARE IF:   You have blood or brown flecks (like coffee grounds) in your vomit.  You have black or bloody stools.  You have a severe headache or stiff neck.  You are confused.  You have severe abdominal pain.  You have chest pain or trouble breathing.  You do not urinate at least once every 8 hours.  You develop cold or clammy skin.  You continue to vomit for longer than 24 to 48 hours.  You have a fever. MAKE SURE YOU:   Understand these instructions.  Will watch your condition.  Will get help right away if you are not doing well or get worse. Document Released: 05/22/2005 Document Revised: 08/14/2011 Document Reviewed: 10/19/2010 [employer] Patient Information 2015 [employer]. This information is not intended   to replace advice given to you by your health care provider. Make sure you discuss any questions you have with your health care provider. Diarrhea Diarrhea is frequent loose and watery  bowel movements. It can cause you to feel weak and dehydrated. Dehydration can cause you to become tired and thirsty, have a dry mouth, and have decreased urination that often is dark yellow. Diarrhea is a sign of another problem, most often an infection that will not last long. In most cases, diarrhea typically lasts 2-3 days. However, it can last longer if it is a sign of something more serious. It is important to treat your diarrhea as directed by your caregiver to lessen or prevent future episodes of diarrhea. CAUSES  Some common causes include:  Gastrointestinal infections caused by viruses, bacteria, or parasites.  Food poisoning or food allergies.  Certain medicines, such as antibiotics, chemotherapy, and laxatives.  Artificial sweeteners and fructose.  Digestive disorders. HOME CARE INSTRUCTIONS  Ensure adequate fluid intake (hydration): Have 1 cup (8 oz) of fluid for each diarrhea episode. Avoid fluids that contain simple sugars or sports drinks, fruit juices, whole milk products, and sodas. Your urine should be clear or pale yellow if you are drinking enough fluids. Hydrate with an oral rehydration solution that you can purchase at pharmacies, retail stores, and online. You can prepare an oral rehydration solution at home by mixing the following ingredients together:   - tsp table salt.   tsp baking soda.   tsp salt substitute containing potassium chloride.  1  tablespoons sugar.  1 L (34 oz) of water.  Certain foods and beverages may increase the speed at which food moves through the gastrointestinal (GI) tract. These foods and beverages should be avoided and include:  Caffeinated and alcoholic beverages.  High-fiber foods, such as raw fruits and vegetables, nuts, seeds, and whole grain breads and cereals.  Foods and beverages sweetened with sugar alcohols, such as xylitol, sorbitol, and mannitol.  Some foods may be well tolerated and may help thicken stool  including:  Starchy foods, such as rice, toast, pasta, low-sugar cereal, oatmeal, grits, baked potatoes, crackers, and bagels.  Bananas.  Applesauce.  Add probiotic-rich foods to help increase healthy bacteria in the GI tract, such as yogurt and fermented milk products.  Wash your hands well after each diarrhea episode.  Only take over-the-counter or prescription medicines as directed by your caregiver.  Take a warm bath to relieve any burning or pain from frequent diarrhea episodes. SEEK IMMEDIATE MEDICAL CARE IF:   You are unable to keep fluids down.  You have persistent vomiting.  You have blood in your stool, or your stools are black and tarry.  You do not urinate in 6-8 hours, or there is only a small amount of very dark urine.  You have abdominal pain that increases or localizes.  You have weakness, dizziness, confusion, or light-headedness.  You have a severe headache.  Your diarrhea gets worse or does not get better.  You have a fever or persistent symptoms for more than 2-3 days.  You have a fever and your symptoms suddenly get worse. MAKE SURE YOU:   Understand these instructions.  Will watch your condition.  Will get help right away if you are not doing well or get worse. Document Released: 05/12/2002 Document Revised: 10/06/2013 Document Reviewed: 01/28/2012 [facility] Patient Information 2015 [place], [place]. This information is not intended to replace advice given to you by your health care provider. Make sure you discuss any  questions you have with your health care provider. Food Poisoning Food poisoning is an illness caused by something you ate or drank. There are over 250 known causes of food poisoning. However, many other causes are unknown.You can be treated even if the exact cause of your food poisoning is not known. In most cases, food poisoning is mild and lasts 1 to 2 days. However, some cases can be serious, especially for people with low immune  systems, the elderly, children and infants, and pregnant women. CAUSES  Poor personal hygiene, improper cleaning of storage and preparation areas, and unclean utensils can cause infection or tainting (contamination) of foods. The causes of food poisoning are numerous.Infectious agents, such as viruses, bacteria, or parasites, can cause harm by infecting the intestine and disrupting the absorption of nutrients and water. This can cause diarrhea and lead to dehydration. Viruses are responsible for most of the food poisonings in which an agent is found. Parasites are less likely to cause food poisoning. Toxic agents, such as poisonous mushrooms, marine algae, and pesticides can also cause food poisoning.  Viral causes of food poisoning include:  Norovirus.  Rotavirus.  Hepatitis A.  Bacterial causes of food poisoning include:  Salmonellae.  Campylobacter.  Bacillus cereus.  Escherichia coli (E. coli).  Shigella.  Listeria monocytogenes.  Clostridium botulinum (botulism).  Vibrio cholerae.  Parasites that can cause food poisoning include:  Giardia.  Cryptosporidium.  Toxoplasma. SYMPTOMS Symptoms may appear several hours or longer after consuming the contaminated food or drink. Symptoms may include:  Nausea.  Vomiting.  Cramping.  Diarrhea.  Fever and chills.  Muscle aches. DIAGNOSIS Your health care provider may be able to diagnose food poisoning from a list of what you have recently eaten and results from lab tests. Diagnostic tests may include an exam of the feces. TREATMENT In most cases, treatment focuses on helping to relieve your symptoms and staying well hydrated. Antibiotic medicines are rarely needed. In severe cases, hospitalization may be required. HOME CARE INSTRUCTIONS   Drink enough water and fluids to keep your urine clear or pale yellow. Drink small amounts of fluids frequently and increase as tolerated.  Ask your health care provider for  specific rehydration instructions.  Avoid:  Foods high in sugar.  Alcohol.  Carbonated drinks.  Tobacco.  Juice.  Caffeine drinks.  Extremely hot or cold fluids.  Fatty, greasy foods.  Too much intake of anything at one time.  Dairy products until 24 to 48 hours after diarrhea stops.  You may consume probiotics. Probiotics are active cultures of beneficial bacteria. They may lessen the amount and number of diarrheal stools in adults. Probiotics can be found in yogurt with active cultures and in supplements.  Wash your hands well to avoid spreading the bacteria.  Take medicines only as directed by your health care provider. Do not give your child aspirin because of the association with Reye's syndrome.  Ask your health care provider if you should continue to take your regular prescribed and over-the-counter medicines. PREVENTION   Wash your hands, food preparation surfaces, and utensils thoroughly before and after handling raw foods.  Keep refrigerated foods below 49F (5C).  Serve hot foods immediately or keep them heated above 149F (60C).  Divide large volumes of food into small portions for rapid cooling in the refrigerator. Hot, bulky foods in the refrigerator can raise the temperature of other foods that have already cooled.  Follow approved canning procedures.  Heat canned foods thoroughly before tasting.  When in  doubt, throw it out.  Infants, the elderly, women who are pregnant, and people with compromised immune systems are especially susceptible to food poisoning. These people should never consume unpasteurized cheese, unpasteurized cider, raw fish, raw seafood, or raw meat-type products. SEEK IMMEDIATE MEDICAL CARE IF:   You have difficulty breathing, swallowing, talking, or moving.  You develop blurred vision.  You are unable to keep fluids down.  You faint or nearly faint.  Your eyes turn yellow.  Vomiting or diarrhea develops or becomes  persistent.  Abdominal pain develops, increases, or localizes in one small area.  You have a fever.  The diarrhea becomes excessive or contains blood or mucus.  You develop excessive weakness, dizziness, or extreme thirst.  You have no urine for 8 hours. MAKE SURE YOU:   Understand these instructions.  Will watch your condition.  Will get help right away if you are not doing well or get worse. Document Released: 02/18/2004 Document Revised: 10/06/2013 Document Reviewed: 10/06/2010 [facility] Patient Information 2015 [place], [place]. This information is not intended to replace advice given to you by your health care provider. Make sure you discuss any questions you have with your health care provider. Rash A rash is a change in the color or texture of your skin. There are many different types of rashes. You may have other problems that accompany your rash. CAUSES   Infections.  Allergic reactions. This can include allergies to pets or foods.  Certain medicines.  Exposure to certain chemicals, soaps, or cosmetics.  Heat.  Exposure to poisonous plants.  Tumors, both cancerous and noncancerous. SYMPTOMS   Redness.  Scaly skin.  Itchy skin.  Dry or cracked skin.  Bumps.  Blisters.  Pain. DIAGNOSIS  Your caregiver may do a physical exam to determine what type of rash you have. A skin sample (biopsy) may be taken and examined under a microscope. TREATMENT  Treatment depends on the type of rash you have. Your caregiver may prescribe certain medicines. For serious conditions, you may need to see a skin doctor (dermatologist). HOME CARE INSTRUCTIONS   Avoid the substance that caused your rash.  Do not scratch your rash. This can cause infection.  You may take cool baths to help stop itching.  Only take over-the-counter or prescription medicines as directed by your caregiver.  Keep all follow-up appointments as directed by your caregiver. SEEK IMMEDIATE MEDICAL  CARE IF:  You have increasing pain, swelling, or redness.  You have a fever.  You have new or severe symptoms.  You have body aches, diarrhea, or vomiting.  Your rash is not better after 3 days. MAKE SURE YOU:  Understand these instructions.  Will watch your condition.  Will get help right away if you are not doing well or get worse. Document Released: 05/12/2002 Document Revised: 08/14/2011 Document Reviewed: 03/06/2011 [facility] Patient Information 2015 [place], [place]. This information is not intended to replace advice given to you by your health care provider. Make sure you discuss any questions you have with your health care provider. Epinephrine injection (Auto-injector) What is this medicine? EPINEPHRINE (ep i NEF rin) is used for the emergency treatment of severe allergic reactions. You should keep this medicine with you at all times. This medicine may be used for other purposes; ask your health care provider or pharmacist if you have questions. COMMON BRAND NAME(S): Adrenaclick, Auvi-Q, EpiPen, Twinject What should I tell my health care provider before I take this medicine? They need to know if you have any of  the following conditions: -diabetes -heart disease -high blood pressure -lung or breathing disease, like asthma -Parkinson's disease -thyroid disease -an unusual or allergic reaction to epinephrine, sulfites, other medicines, foods, dyes, or preservatives -pregnant or trying to get pregnant -breast-feeding How should I use this medicine? This medicine is for injection into the outer thigh. Your doctor or health care professional will instruct you on the proper use of the device during an emergency. Read all directions carefully and make sure you understand them. Do not use more often than directed. Talk to your pediatrician regarding the use of this medicine in children. Special care may be needed. This drug is commonly used in children. A special device is  available for use in children. Overdosage: If you think you have taken too much of this medicine contact a poison control center or emergency room at once. NOTE: This medicine is only for you. Do not share this medicine with others. What if I miss a dose? This does not apply. You should only use this medicine for an allergic reaction. What may interact with this medicine? This medicine is only used during an emergency. Significant drug interactions are not likely during emergency use. This list may not describe all possible interactions. Give your health care provider a list of all the medicines, herbs, non-prescription drugs, or dietary supplements you use. Also tell them if you smoke, drink alcohol, or use illegal drugs. Some items may interact with your medicine. What should I watch for while using this medicine? Keep this medicine ready for use in the case of a severe allergic reaction. Make sure that you have the phone number of your doctor or health care professional and local hospital ready. Remember to check the expiration date of your medicine regularly. You may need to have additional units of this medicine with you at work, school, or other places. Talk to your doctor or health care professional about your need for extra units. Some emergencies may require an additional dose. Check with your doctor or a health care professional before using an extra dose. After use, go to the nearest hospital or call 911. Avoid physical activity. Make sure the treating health care professional knows you have received an injection of this medicine. You will receive additional instructions on what to do during and after use of this medicine before a medical emergency occurs. What side effects may I notice from receiving this medicine? Side effects that you should report to your doctor or health care professional as soon as possible: -allergic reactions like skin rash, itching or hives, swelling of the face,  lips, or tongue -breathing problems -chest pain -flushing -irregular or pounding heartbeat -numbness in fingers or toes -vomiting Side effects that usually do not require medical attention (report to your doctor or health care professional if they continue or are bothersome): -anxiety or nervousness -dizzy, drowsy -dry mouth -headache -increased sweating -nausea -tired, weak This list may not describe all possible side effects. Call your doctor for medical advice about side effects. You may report side effects to FDA at 1-800-FDA-1088. Where should I keep my medicine? Keep out of the reach of children. Store at room temperature between 15 and 30 degrees C (59 and 86 degrees F). Protect from light and heat. The solution should be clear in color. If the solution is discolored or contains particles it must be replaced. Throw away any unused medicine after the expiration date. Ask your doctor or pharmacist about proper disposal of the injector if it  is expired or has been used. Always replace your auto-injector before it expires. NOTE: This sheet is a summary. It may not cover all possible information. If you have questions about this medicine, talk to your doctor, pharmacist, or health care provider.  2015, [employer]. (2012-09-30 14:59:01)

## 2014-11-30 ENCOUNTER — Encounter (HOSPITAL_COMMUNITY): Payer: Self-pay

## 2015-06-29 ENCOUNTER — Other Ambulatory Visit: Payer: Self-pay | Admitting: Physician Assistant

## 2015-06-29 DIAGNOSIS — R1011 Right upper quadrant pain: Secondary | ICD-10-CM

## 2015-06-29 DIAGNOSIS — R1013 Epigastric pain: Secondary | ICD-10-CM

## 2015-07-02 ENCOUNTER — Inpatient Hospital Stay: Admission: RE | Admit: 2015-07-02 | Payer: Medicaid Other | Source: Ambulatory Visit

## 2015-07-05 ENCOUNTER — Emergency Department (HOSPITAL_COMMUNITY)
Admission: EM | Admit: 2015-07-05 | Discharge: 2015-07-05 | Disposition: A | Discharge status: Home / Self Care | Payer: Medicaid Other | Attending: Emergency Medicine | Admitting: Emergency Medicine

## 2015-07-05 ENCOUNTER — Encounter (HOSPITAL_COMMUNITY): Payer: Self-pay | Admitting: Emergency Medicine

## 2015-07-05 DIAGNOSIS — Z3202 Encounter for pregnancy test, result negative: Secondary | ICD-10-CM | POA: Insufficient documentation

## 2015-07-05 DIAGNOSIS — K8020 Calculus of gallbladder without cholecystitis without obstruction: Secondary | ICD-10-CM | POA: Diagnosis not present

## 2015-07-05 DIAGNOSIS — Z8679 Personal history of other diseases of the circulatory system: Secondary | ICD-10-CM | POA: Diagnosis not present

## 2015-07-05 DIAGNOSIS — Z87440 Personal history of urinary (tract) infections: Secondary | ICD-10-CM | POA: Diagnosis not present

## 2015-07-05 DIAGNOSIS — Z79899 Other long term (current) drug therapy: Secondary | ICD-10-CM | POA: Diagnosis not present

## 2015-07-05 DIAGNOSIS — R1013 Epigastric pain: Secondary | ICD-10-CM | POA: Diagnosis present

## 2015-07-05 DIAGNOSIS — E039 Hypothyroidism, unspecified: Secondary | ICD-10-CM | POA: Insufficient documentation

## 2015-07-05 LAB — CBC WITH DIFFERENTIAL/PLATELET
BASOS ABS: 0 10*3/uL (ref 0.0–0.1)
Basophils Relative: 0 %
EOS ABS: 0.3 10*3/uL (ref 0.0–0.7)
Eosinophils Relative: 3 %
HCT: 38.3 % (ref 36.0–46.0)
Hemoglobin: 12.9 g/dL (ref 12.0–15.0)
LYMPHS ABS: 2.2 10*3/uL (ref 0.7–4.0)
Lymphocytes Relative: 27 %
MCH: 29.1 pg (ref 26.0–34.0)
MCHC: 33.7 g/dL (ref 30.0–36.0)
MCV: 86.5 fL (ref 78.0–100.0)
Monocytes Absolute: 0.5 10*3/uL (ref 0.1–1.0)
Monocytes Relative: 7 %
NEUTROS PCT: 63 %
Neutro Abs: 5 10*3/uL (ref 1.7–7.7)
Platelets: 290 10*3/uL (ref 150–400)
RBC: 4.43 MIL/uL (ref 3.87–5.11)
RDW: 13.3 % (ref 11.5–15.5)
WBC: 8.1 10*3/uL (ref 4.0–10.5)

## 2015-07-05 LAB — COMPREHENSIVE METABOLIC PANEL
ALK PHOS: 77 U/L (ref 38–126)
ALT: 25 U/L (ref 14–54)
AST: 15 U/L (ref 15–41)
Albumin: 4.2 g/dL (ref 3.5–5.0)
Anion gap: 9 (ref 5–15)
BUN: 13 mg/dL (ref 6–20)
CALCIUM: 9.1 mg/dL (ref 8.9–10.3)
CO2: 23 mmol/L (ref 22–32)
CREATININE: 0.64 mg/dL (ref 0.44–1.00)
Chloride: 107 mmol/L (ref 101–111)
GFR calc non Af Amer: >60 mL/min (ref >60)
GLUCOSE: 108 mg/dL — AB (ref 65–99)
Potassium: 4 mmol/L (ref 3.5–5.1)
SODIUM: 139 mmol/L (ref 135–145)
Total Bilirubin: 0.3 mg/dL (ref 0.3–1.2)
Total Protein: 7.2 g/dL (ref 6.5–8.1)

## 2015-07-05 LAB — URINALYSIS, ROUTINE W REFLEX MICROSCOPIC
Bilirubin Urine: NEGATIVE
GLUCOSE, UA: NEGATIVE mg/dL
HGB URINE DIPSTICK: NEGATIVE
Ketones, ur: NEGATIVE mg/dL
LEUKOCYTES UA: NEGATIVE
Nitrite: NEGATIVE
PH: 6 (ref 5.0–8.0)
Protein, ur: NEGATIVE mg/dL
SPECIFIC GRAVITY, URINE: 1.003 — AB (ref 1.005–1.030)

## 2015-07-05 LAB — POC URINE PREG, ED: PREG TEST UR: NEGATIVE

## 2015-07-05 LAB — LIPASE, BLOOD: Lipase: 35 U/L (ref 11–51)

## 2015-07-05 MED ORDER — TRAMADOL HCL 50 MG PO TABS: 50.0000 mg | ORAL_TABLET | Freq: Four times a day (QID) | ORAL | Status: DC | PRN

## 2015-07-05 MED ORDER — ONDANSETRON HCL 4 MG/2ML IJ SOLN
4.0000 mg | Freq: Once | INTRAMUSCULAR | Status: AC
  Administered 2015-07-05: 4 mg via INTRAVENOUS
  Filled 2015-07-05: qty 2

## 2015-07-05 MED ORDER — PROMETHAZINE HCL 25 MG PO TABS: 25.0000 mg | ORAL_TABLET | Freq: Four times a day (QID) | ORAL | Status: DC | PRN
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
## 2015-07-05 NOTE — ED Notes (Signed)
Pt c/o intermittent epigastric pain radiating to back x 1 year, worsened last week, last Tuesday pt diagnosed with gallstones, as appointment with [facility] with Dr. Carolynne Edouard. Here today for unmanaged pain.

## 2015-07-05 NOTE — Discharge Instructions (Signed)

## 2015-07-05 NOTE — ED Notes (Signed)
Pt appears frustrated with the decision to discharge patient. Requesting to speak to surgeon considering his office referred her here today. Alerted PA

## 2015-08-19 ENCOUNTER — Other Ambulatory Visit: Payer: Self-pay | Admitting: General Surgery

## 2015-08-20 ENCOUNTER — Other Ambulatory Visit: Payer: Self-pay | Admitting: Obstetrics and Gynecology

## 2015-08-20 DIAGNOSIS — N6325 Unspecified lump in the left breast, overlapping quadrants: Secondary | ICD-10-CM

## 2015-08-20 DIAGNOSIS — N6320 Unspecified lump in the left breast, unspecified quadrant: Principal | ICD-10-CM

## 2015-08-25 NOTE — Patient Instructions (Addendum)
Kelly ParkinsonKalynn D Kim  08/25/2015   Your procedure is scheduled on: 09-03-15  [facility]  [facility] to 3rd floor to  Short Stay Center at 630  AM.  Call this number if you have problems the morning of surgery [phone]   Remember: ONLY 1 PERSON MAY GO WITH YOU TO SHORT STAY TO GET  READY MORNING OF YOUR SURGERY.  Do not eat food or drink liquids :After Midnight.     Take these medicines the morning of surgery with A SIP OF WATER:LEVOTHYROXINE (SYNTHROID), CLONAZEPAM IF NEEDED                                You may not have any metal on your body including hair pins and              piercings  Do not wear jewelry, make-up, lotions, powders or perfumes, deodorant             Do not wear nail polish.  Do not shave  48 hours prior to surgery.              Men may shave face and neck.   Do not bring valuables to the hospital. [facility] IS NOT             RESPONSIBLE   FOR VALUABLES.  Contacts, dentures or bridgework may not be worn into surgery.  Leave suitcase in the car. After surgery it may be brought to your room.     Patients discharged the day of surgery will not be allowed to drive home.  Name and phone number of your driver: willie spouse cell [phone]  Special Instructions: N/A             _____________________________________________________________________             [facility] - Preparing for [facility] Before surgery, you can play an important role.  Because skin is not sterile, your skin needs to be as free of germs as possible.  You can reduce the number of germs on your skin by washing with CHG (chlorahexidine gluconate) soap before surgery.  CHG is an antiseptic cleaner which kills germs and bonds with the skin to continue killing germs even after washing. Please DO NOT use if you have an allergy to CHG or antibacterial soaps.  If your skin becomes reddened/irritated stop using the CHG and inform your nurse  when you arrive at Short Stay. Do not shave (including legs and underarms) for at least 48 hours prior to the first CHG shower.  You may shave your face/neck. Please follow these instructions carefully:  1.  Shower with CHG Soap the night before surgery and the  morning of Surgery.  2.  If you choose to wash your hair, wash your hair first as usual with your  normal  shampoo.  3.  After you shampoo, rinse your hair and body thoroughly to remove the  shampoo.                           4.  Use CHG as you would any other liquid soap.  You can apply chg directly  to the skin and wash  Gently with a scrungie or clean washcloth.  5.  Apply the CHG Soap to your body ONLY FROM THE NECK DOWN.   Do not use on face/ open                           Wound or open sores. Avoid contact with eyes, ears mouth and genitals (private parts).                       Wash face,  Genitals (private parts) with your normal soap.             6.  Wash thoroughly, paying special attention to the area where your surgery  will be performed.  7.  Thoroughly rinse your body with warm water from the neck down.  8.  DO NOT shower/wash with your normal soap after using and rinsing off  the CHG Soap.                9.  Pat yourself dry with a clean towel.            10.  Wear clean pajamas.            11.  Place clean sheets on your bed the night of your first shower and do not  sleep with pets. Day of Surgery : Do not apply any lotions/deodorants the morning of surgery.  Please wear clean clothes to the hospital/surgery center.  FAILURE TO FOLLOW THESE INSTRUCTIONS MAY RESULT IN THE CANCELLATION OF YOUR SURGERY PATIENT SIGNATURE_________________________________  NURSE SIGNATURE__________________________________  ________________________________________________________________________

## 2015-08-30 ENCOUNTER — Ambulatory Visit
Admission: RE | Admit: 2015-08-30 | Discharge: 2015-08-30 | Disposition: A | Discharge status: Home / Self Care | Payer: No Typology Code available for payment source | Source: Ambulatory Visit | Attending: Obstetrics and Gynecology | Admitting: Obstetrics and Gynecology

## 2015-08-30 ENCOUNTER — Encounter (INDEPENDENT_AMBULATORY_CARE_PROVIDER_SITE_OTHER): Payer: Self-pay

## 2015-08-30 ENCOUNTER — Encounter (HOSPITAL_COMMUNITY): Payer: Self-pay

## 2015-08-30 ENCOUNTER — Encounter (HOSPITAL_COMMUNITY)
Admission: RE | Admit: 2015-08-30 | Discharge: 2015-08-30 | Disposition: A | Discharge status: Home / Self Care | Payer: Medicaid Other | Source: Ambulatory Visit | Attending: General Surgery | Admitting: General Surgery

## 2015-08-30 DIAGNOSIS — Z01812 Encounter for preprocedural laboratory examination: Secondary | ICD-10-CM | POA: Insufficient documentation

## 2015-08-30 DIAGNOSIS — Z01810 Encounter for preprocedural cardiovascular examination: Secondary | ICD-10-CM | POA: Insufficient documentation

## 2015-08-30 DIAGNOSIS — N6325 Unspecified lump in the left breast, overlapping quadrants: Secondary | ICD-10-CM

## 2015-08-30 DIAGNOSIS — N6320 Unspecified lump in the left breast, unspecified quadrant: Principal | ICD-10-CM

## 2015-08-30 HISTORY — DX: Chest pain, unspecified: R07.9

## 2015-08-30 LAB — CBC
HCT: 40.3 % (ref 36.0–46.0)
Hemoglobin: 13.4 g/dL (ref 12.0–15.0)
MCH: 28.6 pg (ref 26.0–34.0)
MCHC: 33.3 g/dL (ref 30.0–36.0)
MCV: 85.9 fL (ref 78.0–100.0)
PLATELETS: 305 10*3/uL (ref 150–400)
RBC: 4.69 MIL/uL (ref 3.87–5.11)
RDW: 13.3 % (ref 11.5–15.5)
WBC: 8.5 10*3/uL (ref 4.0–10.5)

## 2015-08-30 LAB — HCG, SERUM, QUALITATIVE: PREG SERUM: NEGATIVE

## 2015-08-30 NOTE — Progress Notes (Signed)
PATIENT STATES HAD CHEST PAIN FEW DAYS AGO, WILL DO EKG WITH PRE OP VISIT TODAY.

## 2015-09-02 MED ORDER — DEXTROSE 5 % IV SOLN
3.0000 g | INTRAVENOUS | Status: AC
  Administered 2015-09-03: 3 g via INTRAVENOUS
  Filled 2015-09-02: qty 3000

## 2015-09-03 ENCOUNTER — Encounter (HOSPITAL_COMMUNITY)
Admission: RE | Disposition: A | Discharge status: Home / Self Care | Payer: Self-pay | Source: Ambulatory Visit | Attending: General Surgery

## 2015-09-03 ENCOUNTER — Ambulatory Visit (HOSPITAL_COMMUNITY): Payer: Medicaid Other | Admitting: Certified Registered Nurse Anesthetist

## 2015-09-03 ENCOUNTER — Ambulatory Visit (HOSPITAL_COMMUNITY)
Admission: RE | Admit: 2015-09-03 | Discharge: 2015-09-03 | Disposition: A | Discharge status: Home / Self Care | Payer: Medicaid Other | Source: Ambulatory Visit | Attending: General Surgery | Admitting: General Surgery

## 2015-09-03 ENCOUNTER — Encounter (HOSPITAL_COMMUNITY): Payer: Self-pay | Admitting: *Deleted

## 2015-09-03 ENCOUNTER — Ambulatory Visit (HOSPITAL_COMMUNITY): Payer: Medicaid Other

## 2015-09-03 DIAGNOSIS — L720 Epidermal cyst: Secondary | ICD-10-CM | POA: Insufficient documentation

## 2015-09-03 DIAGNOSIS — E039 Hypothyroidism, unspecified: Secondary | ICD-10-CM | POA: Diagnosis not present

## 2015-09-03 DIAGNOSIS — K8010 Calculus of gallbladder with chronic cholecystitis without obstruction: Secondary | ICD-10-CM | POA: Insufficient documentation

## 2015-09-03 DIAGNOSIS — Z79899 Other long term (current) drug therapy: Secondary | ICD-10-CM | POA: Diagnosis not present

## 2015-09-03 DIAGNOSIS — K8020 Calculus of gallbladder without cholecystitis without obstruction: Secondary | ICD-10-CM

## 2015-09-03 DIAGNOSIS — R109 Unspecified abdominal pain: Secondary | ICD-10-CM | POA: Diagnosis present

## 2015-09-03 HISTORY — PX: CHOLECYSTECTOMY: SHX55

## 2015-09-03 HISTORY — PX: CYST EXCISION: SHX5701

## 2015-09-03 SURGERY — LAPAROSCOPIC CHOLECYSTECTOMY WITH INTRAOPERATIVE CHOLANGIOGRAM
Anesthesia: General | Site: Head

## 2015-09-03 MED ORDER — ONDANSETRON HCL 4 MG/2ML IJ SOLN
INTRAMUSCULAR | Status: AC
  Filled 2015-09-03: qty 2

## 2015-09-03 MED ORDER — DIPHENHYDRAMINE HCL 50 MG/ML IJ SOLN
INTRAMUSCULAR | Status: AC
  Filled 2015-09-03: qty 1

## 2015-09-03 MED ORDER — ROCURONIUM BROMIDE 100 MG/10ML IV SOLN
INTRAVENOUS | Status: AC
  Filled 2015-09-03: qty 1

## 2015-09-03 MED ORDER — ONDANSETRON HCL 4 MG/2ML IJ SOLN
4.0000 mg | Freq: Once | INTRAMUSCULAR | Status: AC | PRN
  Administered 2015-09-03: 4 mg via INTRAVENOUS

## 2015-09-03 MED ORDER — BUPIVACAINE-EPINEPHRINE 0.25% -1:200000 IJ SOLN
INTRAMUSCULAR | Status: AC
  Filled 2015-09-03: qty 1

## 2015-09-03 MED ORDER — DIPHENHYDRAMINE HCL 50 MG/ML IJ SOLN
12.5000 mg | INTRAMUSCULAR | Status: DC | PRN
  Administered 2015-09-03: 12.5 mg via INTRAVENOUS

## 2015-09-03 MED ORDER — BUPIVACAINE-EPINEPHRINE 0.25% -1:200000 IJ SOLN
INTRAMUSCULAR | Status: DC | PRN
  Administered 2015-09-03: 42 mL

## 2015-09-03 MED ORDER — DEXAMETHASONE SODIUM PHOSPHATE 10 MG/ML IJ SOLN
INTRAMUSCULAR | Status: DC | PRN
  Administered 2015-09-03: 10 mg via INTRAVENOUS

## 2015-09-03 MED ORDER — LACTATED RINGERS IV SOLN
INTRAVENOUS | Status: DC
Start: 2015-09-03 — End: 2015-09-03
  Administered 2015-09-03: 10:00:00 via INTRAVENOUS
  Administered 2015-09-03: 1000 mL via INTRAVENOUS

## 2015-09-03 MED ORDER — OXYCODONE-ACETAMINOPHEN 5-325 MG PO TABS: 1.0000 | ORAL_TABLET | ORAL | Status: DC | PRN

## 2015-09-03 MED ORDER — KETOROLAC TROMETHAMINE 30 MG/ML IJ SOLN
INTRAMUSCULAR | Status: AC
  Filled 2015-09-03: qty 1

## 2015-09-03 MED ORDER — HYDROMORPHONE HCL 1 MG/ML IJ SOLN
0.2500 mg | INTRAMUSCULAR | Status: DC | PRN
  Administered 2015-09-03 (×2): 0.5 mg via INTRAVENOUS

## 2015-09-03 MED ORDER — METOCLOPRAMIDE HCL 5 MG/ML IJ SOLN
INTRAMUSCULAR | Status: DC | PRN
Start: 2015-09-03 — End: 2015-09-03
  Administered 2015-09-03: 10 mg via INTRAVENOUS

## 2015-09-03 MED ORDER — IOPAMIDOL (ISOVUE-300) INJECTION 61%
INTRAVENOUS | Status: AC
  Filled 2015-09-03: qty 50

## 2015-09-03 MED ORDER — IOPAMIDOL (ISOVUE-300) INJECTION 61%
INTRAVENOUS | Status: DC | PRN
  Administered 2015-09-03: 5 mL

## 2015-09-03 MED ORDER — FENTANYL CITRATE (PF) 100 MCG/2ML IJ SOLN
INTRAMUSCULAR | Status: DC | PRN
  Administered 2015-09-03 (×6): 50 ug via INTRAVENOUS

## 2015-09-03 MED ORDER — FENTANYL CITRATE (PF) 250 MCG/5ML IJ SOLN
INTRAMUSCULAR | Status: AC
  Filled 2015-09-03: qty 5

## 2015-09-03 MED ORDER — MIDAZOLAM HCL 5 MG/5ML IJ SOLN
INTRAMUSCULAR | Status: DC | PRN
  Administered 2015-09-03: 2 mg via INTRAVENOUS

## 2015-09-03 MED ORDER — HYDROCODONE-ACETAMINOPHEN 7.5-325 MG PO TABS: 1.0000 | ORAL_TABLET | Freq: Once | ORAL | Status: DC | PRN

## 2015-09-03 MED ORDER — PROPOFOL 10 MG/ML IV BOLUS
INTRAVENOUS | Status: DC | PRN
  Administered 2015-09-03: 200 mg via INTRAVENOUS

## 2015-09-03 MED ORDER — 0.9 % SODIUM CHLORIDE (POUR BTL) OPTIME
TOPICAL | Status: DC | PRN
  Administered 2015-09-03: 1000 mL

## 2015-09-03 MED ORDER — KETOROLAC TROMETHAMINE 30 MG/ML IJ SOLN
30.0000 mg | Freq: Once | INTRAMUSCULAR | Status: AC
  Administered 2015-09-03: 30 mg via INTRAVENOUS

## 2015-09-03 MED ORDER — METOCLOPRAMIDE HCL 5 MG/ML IJ SOLN
INTRAMUSCULAR | Status: AC
  Filled 2015-09-03: qty 2

## 2015-09-03 MED ORDER — LIDOCAINE HCL (CARDIAC) 20 MG/ML IV SOLN
INTRAVENOUS | Status: AC
  Filled 2015-09-03: qty 5

## 2015-09-03 MED ORDER — MIDAZOLAM HCL 2 MG/2ML IJ SOLN
INTRAMUSCULAR | Status: AC
  Filled 2015-09-03: qty 2

## 2015-09-03 MED ORDER — ROCURONIUM BROMIDE 100 MG/10ML IV SOLN
INTRAVENOUS | Status: DC | PRN
  Administered 2015-09-03: 5 mg via INTRAVENOUS
  Administered 2015-09-03: 50 mg via INTRAVENOUS
  Administered 2015-09-03: 10 mg via INTRAVENOUS

## 2015-09-03 MED ORDER — ONDANSETRON HCL 4 MG/2ML IJ SOLN
INTRAMUSCULAR | Status: DC | PRN
  Administered 2015-09-03: 4 mg via INTRAVENOUS

## 2015-09-03 MED ORDER — LIDOCAINE HCL (CARDIAC) 20 MG/ML IV SOLN
INTRAVENOUS | Status: DC | PRN
  Administered 2015-09-03: 100 mg via INTRAVENOUS

## 2015-09-03 MED ORDER — LACTATED RINGERS IR SOLN
Status: DC | PRN
  Administered 2015-09-03: 1000 mL

## 2015-09-03 MED ORDER — FENTANYL CITRATE (PF) 100 MCG/2ML IJ SOLN
INTRAMUSCULAR | Status: AC
  Filled 2015-09-03: qty 2

## 2015-09-03 MED ORDER — PROMETHAZINE HCL 25 MG/ML IJ SOLN
INTRAMUSCULAR | Status: AC
  Filled 2015-09-03: qty 1

## 2015-09-03 MED ORDER — GLYCOPYRROLATE 0.2 MG/ML IJ SOLN
INTRAMUSCULAR | Status: DC | PRN
  Administered 2015-09-03: .8 mg via INTRAVENOUS

## 2015-09-03 MED ORDER — NEOSTIGMINE METHYLSULFATE 10 MG/10ML IV SOLN
INTRAVENOUS | Status: DC | PRN
  Administered 2015-09-03: 5 mg via INTRAVENOUS

## 2015-09-03 MED ORDER — PROMETHAZINE HCL 25 MG/ML IJ SOLN
6.2500 mg | INTRAMUSCULAR | Status: AC | PRN
  Administered 2015-09-03 (×2): 12.5 mg via INTRAVENOUS

## 2015-09-03 MED ORDER — BACITRACIN-NEOMYCIN-POLYMYXIN 400-5-5000 EX OINT
TOPICAL_OINTMENT | CUTANEOUS | Status: AC
  Filled 2015-09-03: qty 1

## 2015-09-03 MED ORDER — PROPOFOL 10 MG/ML IV BOLUS
INTRAVENOUS | Status: AC
  Filled 2015-09-03: qty 20

## 2015-09-03 MED ORDER — HYDROMORPHONE HCL 1 MG/ML IJ SOLN
INTRAMUSCULAR | Status: AC
  Filled 2015-09-03: qty 1

## 2015-09-03 SURGICAL SUPPLY — 46 items
APPLIER CLIP 5 13 M/L LIGAMAX5 (MISCELLANEOUS) ×4
APR CLP MED LRG 5 ANG JAW (MISCELLANEOUS) ×2
BAG SPEC RTRVL LRG 6X4 10 (ENDOMECHANICALS)
CATH REDDICK CHOLANGI 4FR 50CM (CATHETERS) ×4 IMPLANT
CHLORAPREP W/TINT 26ML (MISCELLANEOUS) ×4 IMPLANT
CLIP APPLIE 5 13 M/L LIGAMAX5 (MISCELLANEOUS) ×2 IMPLANT
COVER MAYO STAND STRL (DRAPES) ×4 IMPLANT
COVER SURGICAL LIGHT HANDLE (MISCELLANEOUS) ×6 IMPLANT
DECANTER SPIKE VIAL GLASS SM (MISCELLANEOUS) ×4 IMPLANT
DRAPE C-ARM 42X120 X-RAY (DRAPES) ×4 IMPLANT
DRAPE LAPAROSCOPIC ABDOMINAL (DRAPES) ×4 IMPLANT
DRAPE LAPAROTOMY T 98X78 PEDS (DRAPES) ×2 IMPLANT
ELECT REM PT RETURN 9FT ADLT (ELECTROSURGICAL) ×4
ELECTRODE REM PT RTRN 9FT ADLT (ELECTROSURGICAL) ×2 IMPLANT
GAUZE SPONGE 4X4 12PLY STRL (GAUZE/BANDAGES/DRESSINGS) ×3 IMPLANT
GLOVE BIO SURGEON STRL SZ 6.5 (GLOVE) ×1 IMPLANT
GLOVE BIO SURGEON STRL SZ7 (GLOVE) ×2 IMPLANT
GLOVE BIO SURGEON STRL SZ7.5 (GLOVE) ×6 IMPLANT
GLOVE BIO SURGEONS STRL SZ 6.5 (GLOVE) ×1
GLOVE BIOGEL PI IND STRL 6.5 (GLOVE) IMPLANT
GLOVE BIOGEL PI IND STRL 7.0 (GLOVE) ×1 IMPLANT
GLOVE BIOGEL PI IND STRL 7.5 (GLOVE) IMPLANT
GLOVE BIOGEL PI INDICATOR 6.5 (GLOVE) ×2
GLOVE BIOGEL PI INDICATOR 7.0 (GLOVE) ×2
GLOVE BIOGEL PI INDICATOR 7.5 (GLOVE) ×2
GLOVE SURG SS PI 7.5 STRL IVOR (GLOVE) ×2 IMPLANT
GOWN STRL REUS W/ TWL LRG LVL3 (GOWN DISPOSABLE) IMPLANT
GOWN STRL REUS W/TWL LRG LVL3 (GOWN DISPOSABLE) ×7 IMPLANT
GOWN STRL REUS W/TWL XL LVL3 (GOWN DISPOSABLE) ×15 IMPLANT
IV CATH 14GX2 1/4 (CATHETERS) ×4 IMPLANT
KIT BASIN OR (CUSTOM PROCEDURE TRAY) ×4 IMPLANT
LIQUID BAND (GAUZE/BANDAGES/DRESSINGS) ×4 IMPLANT
POSITIONER SURGICAL ARM (MISCELLANEOUS) IMPLANT
POUCH SPECIMEN RETRIEVAL 10MM (ENDOMECHANICALS) IMPLANT
SET IRRIG TUBING LAPAROSCOPIC (IRRIGATION / IRRIGATOR) ×4 IMPLANT
SLEEVE XCEL OPT CAN 5 100 (ENDOMECHANICALS) ×8 IMPLANT
STOCKINETTE 6  STRL (DRAPES) ×2
STOCKINETTE 6 STRL (DRAPES) ×1 IMPLANT
SUT ETHILON 3 0 PS 1 (SUTURE) ×2 IMPLANT
SUT MNCRL AB 4-0 PS2 18 (SUTURE) ×6 IMPLANT
TOWEL OR 17X26 10 PK STRL BLUE (TOWEL DISPOSABLE) ×6 IMPLANT
TOWEL OR NON WOVEN STRL DISP B (DISPOSABLE) ×6 IMPLANT
TRAY LAPAROSCOPIC (CUSTOM PROCEDURE TRAY) ×4 IMPLANT
TROCAR BLADELESS OPT 5 100 (ENDOMECHANICALS) ×4 IMPLANT
TROCAR XCEL BLUNT TIP 100MML (ENDOMECHANICALS) ×4 IMPLANT
YANKAUER SUCT BULB TIP 10FT TU (MISCELLANEOUS) ×2 IMPLANT

## 2015-09-03 NOTE — Anesthesia Preprocedure Evaluation (Addendum)
Anesthesia Evaluation  Patient identified by MRN, date of birth, ID band Patient awake    Reviewed: Allergy & Precautions, NPO status , Patient's Chart, lab work & pertinent test results  Airway Mallampati: I  TM Distance: >3 FB Neck ROM: Full    Dental  (+) Dental Advisory Given   Pulmonary neg pulmonary ROS,    breath sounds clear to auscultation       Cardiovascular negative cardio ROS   Rhythm:Regular Rate:Normal     Neuro/Psych  Headaches,    GI/Hepatic negative GI ROS, Neg liver ROS,   Endo/Other  Hypothyroidism   Renal/GU negative Renal ROS     Musculoskeletal negative musculoskeletal ROS (+)   Abdominal   Peds  Hematology negative hematology ROS (+)   Anesthesia Other Findings   Reproductive/Obstetrics                            Lab Results  Component Value Date   WBC 8.5 08/30/2015   HGB 13.4 08/30/2015   HCT 40.3 08/30/2015   MCV 85.9 08/30/2015   PLT 305 08/30/2015   Lab Results  Component Value Date   CREATININE 0.64 07/05/2015   BUN 13 07/05/2015   NA 139 07/05/2015   K 4.0 07/05/2015   CL 107 07/05/2015   CO2 23 07/05/2015    Anesthesia Physical Anesthesia Plan  ASA: II  Anesthesia Plan: General   Post-op Pain Management:    Induction: Intravenous  Airway Management Planned: Oral ETT  Additional Equipment:   Intra-op Plan:   Post-operative Plan: Extubation in OR  Informed Consent: I have reviewed the patients History and Physical, chart, labs and discussed the procedure including the risks, benefits and alternatives for the proposed anesthesia with the patient or authorized representative who has indicated his/her understanding and acceptance.   Dental advisory given  Plan Discussed with: CRNA  Anesthesia Plan Comments:         Anesthesia Quick Evaluation

## 2015-09-03 NOTE — Interval H&P Note (Signed)
History and Physical Interval Note:  09/03/2015 8:18 AM  Elinor ParkinsonKalynn D Geisen  has presented today for surgery, with the diagnosis of gallstones, cyst scalp  The various methods of treatment have been discussed with the patient and family. After consideration of risks, benefits and other options for treatment, the patient has consented to  Procedure(s): LAPAROSCOPIC CHOLECYSTECTOMY WITH INTRAOPERATIVE CHOLANGIOGRAM (N/A) CYST REMOVAL SCALP (N/A) as a surgical intervention .  The patient's history has been reviewed, patient examined, no change in status, stable for surgery.  I have reviewed the patient's chart and labs.  Questions were answered to the patient's satisfaction.     TOTH III,Jemuel Laursen S

## 2015-09-03 NOTE — Anesthesia Postprocedure Evaluation (Signed)
Anesthesia Post Note  Patient: Elinor ParkinsonKalynn D Job  Procedure(s) Performed: Procedure(s) (LRB): LAPAROSCOPIC CHOLECYSTECTOMY WITH INTRAOPERATIVE CHOLANGIOGRAM (N/A) CYST REMOVAL SCALP (N/A)  Patient location during evaluation: PACU Anesthesia Type: General Level of consciousness: awake and alert Pain management: pain level controlled Vital Signs Assessment: post-procedure vital signs reviewed and stable Respiratory status: spontaneous breathing, nonlabored ventilation, respiratory function stable and patient connected to nasal cannula oxygen Cardiovascular status: blood pressure returned to baseline and stable Postop Assessment: no signs of nausea or vomiting Anesthetic complications: no    Last Vitals:  Filed Vitals:   09/03/15 1145 09/03/15 1157  BP: 122/73 98/50  Pulse: 82 82  Temp: 36.7 C 36.4 C  Resp: 15 15    Last Pain:  Filed Vitals:   09/03/15 1203  PainSc: 3                  Kennieth RadFitzgerald, Kadi Hession E

## 2015-09-03 NOTE — Transfer of Care (Signed)
Immediate Anesthesia Transfer of Care Note  Patient: Kelly ParkinsonKalynn D Drennen  Procedure(s) Performed: Procedure(s): LAPAROSCOPIC CHOLECYSTECTOMY WITH INTRAOPERATIVE CHOLANGIOGRAM (N/A) CYST REMOVAL SCALP (N/A)  Patient Location: PACU  Anesthesia Type:General  Level of Consciousness:  sedated, patient cooperative and responds to stimulation  Airway & Oxygen Therapy:Patient Spontanous Breathing and Patient connected to face mask oxgen  Post-op Assessment:  Report given to PACU RN and Post -op Vital signs reviewed and stable  Post vital signs:  Reviewed and stable  Last Vitals:  Filed Vitals:   09/03/15 0642  BP: 127/84  Pulse: 96  Temp: 36.9 C  Resp: 16    Complications: No apparent anesthesia complications

## 2015-09-03 NOTE — H&P (Signed)
Kelly ParkinsonKalynn D. Kim  [facility] Patient #: [id number] DOB: [birth year] Married / Language: English / Race: White Female   History of Present Illness  The patient is a 30 year old female who presents for a follow-up for Abdominal pain. We are asked to see the patient in consultation by Dr. Sherlyn Lickedmon to evaluate her for gallstones. The patient is a 30 year old white female who first had an episode of right upper quadrant pain after the birth of her child in October 2015. About 2 months ago she began having the same pain again. The pain is located in the epigastric and right upper quadrant region and radiates to her back. The pain is associated with significant nausea. He underwent an ultrasound which did show stones in the gallbladder but no gallbladder wall thickening or ductal dilatation. Her liver functions were normal.   Allergies No Known Drug Allergies01/19/2017  Medication History KlonoPIN (0.5MG  Tablet, Oral as needed) Active. Levothroid (25MCG Tablet, Oral) Active. Omeprazole (20MG  Capsule DR, Oral) Active. TraMADol HCl (50MG  Tablet, Oral) Active. Medications Reconciled    Review of Systems  General Present- Fatigue. Not Present- Appetite Loss, Chills, Fever, Night Sweats, Weight Gain and Weight Loss. Skin Present- Hives. Not Present- Change in Wart/Mole, Dryness, Jaundice, New Lesions, Non-Healing Wounds, Rash and Ulcer. HEENT Present- Ringing in the Ears, Seasonal Allergies and Wears glasses/contact lenses. Not Present- Earache, Hearing Loss, Hoarseness, Nose Bleed, Oral Ulcers, Sinus Pain, Sore Throat, Visual Disturbances and Yellow Eyes. Breast Present- Breast Mass and Nipple Discharge. Not Present- Breast Pain and Skin Changes. Cardiovascular Present- Difficulty Breathing Lying Down and Palpitations. Not Present- Chest Pain, Leg Cramps, Rapid Heart Rate, Shortness of Breath and Swelling of Extremities. Gastrointestinal Present- Abdominal Pain,  Constipation, Difficulty Swallowing, Indigestion and Rectal Pain. Not Present- Bloating, Bloody Stool, Change in Bowel Habits, Chronic diarrhea, Excessive gas, Gets full quickly at meals, Hemorrhoids, Nausea and Vomiting. Female Genitourinary Not Present- Frequency, Nocturia, Painful Urination, Pelvic Pain and Urgency. Musculoskeletal Present- Back Pain, Joint Pain, Joint Stiffness, Muscle Pain, Muscle Weakness and Swelling of Extremities. Neurological Present- Decreased Memory and Headaches. Not Present- Fainting, Numbness, Seizures, Tingling, Tremor, Trouble walking and Weakness. Psychiatric Present- Anxiety. Not Present- Bipolar, Change in Sleep Pattern, Depression, Fearful and Frequent crying. Endocrine Present- Cold Intolerance. Not Present- Excessive Hunger, Hair Changes, Heat Intolerance, Hot flashes and New Diabetes.  Vitals  Weight: 269 lb Height: 70in Body Surface Area: 2.37 m Body Mass Index: 38.6 kg/m  Temp.: 97.21F(Temporal)  Pulse: 63 (Regular)  BP: 130/68 (Sitting, Left Arm, Standard)       Physical Exam  General Mental Status-Alert. General Appearance-Consistent with stated age. Hydration-Well hydrated. Voice-Normal.  Head and Neck Head-normocephalic, atraumatic with no lesions or palpable masses. Trachea-midline. Thyroid Gland Characteristics - normal size and consistency.  Eye Eyeball - Bilateral-Extraocular movements intact. Sclera/Conjunctiva - Bilateral-No scleral icterus.  Chest and Lung Exam Chest and lung exam reveals -quiet, even and easy respiratory effort with no use of accessory muscles and on auscultation, normal breath sounds, no adventitious sounds and normal vocal resonance. Inspection Chest Wall - Normal. Back - normal.  Cardiovascular Cardiovascular examination reveals -normal heart sounds, regular rate and rhythm with no murmurs and normal pedal pulses bilaterally.  Abdomen Inspection Inspection of the  abdomen reveals - No Hernias. Skin - Scar - no surgical scars. Palpation/Percussion Palpation and Percussion of the abdomen reveal - Soft, Non Tender, No Rebound tenderness, No Rigidity (guarding) and No hepatosplenomegaly. Auscultation Auscultation of the abdomen reveals - Bowel sounds normal.  Neurologic Neurologic evaluation reveals -alert and oriented x 3 with no impairment of recent or remote memory. Mental Status-Normal.  Musculoskeletal Normal Exam - Left-Upper Extremity Strength Normal and Lower Extremity Strength Normal. Normal Exam - Right-Upper Extremity Strength Normal and Lower Extremity Strength Normal.  Lymphatic Head & Neck  General Head & Neck Lymphatics: Bilateral - Description - Normal. Axillary  General Axillary Region: Bilateral - Description - Normal. Tenderness - Non Tender. Femoral & Inguinal  Generalized Femoral & Inguinal Lymphatics: Bilateral - Description - Normal. Tenderness - Non Tender.    Assessment & Plan  GALLSTONES (K80.20) Impression: The patient appears to have symptomatically gallstones. Because of the risk of further painful episodes and possible pancreatitis at think she would benefit from having the gallbladder removed. She would also like to have this done. I have discussed with her in detail the risks and benefits of the operation to remove the gallbladder as well as some of the technical aspects and she understands and wishes to proceed. She was also recently seen with a cyst on her scalp and she would like to have this removed at the same time. I think this is a reasonable thing to do. Current Plans Pt Education - Gallstones: discussed with patient and provided information.   Signed by Caleen Essex, MD

## 2015-09-03 NOTE — Op Note (Addendum)
09/03/2015  10:11 AM  PATIENT:  Kelly Kim  30 y.o. female  PRE-OPERATIVE DIAGNOSIS:  gallstones, cyst scalp  POST-OPERATIVE DIAGNOSIS:  gallstones, cyst scalp  PROCEDURE:  Procedure(s): LAPAROSCOPIC CHOLECYSTECTOMY WITH INTRAOPERATIVE CHOLANGIOGRAM (N/A) CYST REMOVAL SCALP (N/A)  SURGEON:  Surgeon(s) and Role:    * Griselda Miner, MD - Primary  PHYSICIAN ASSISTANT:   ASSISTANTS: Blima Rich, RNFA   ANESTHESIA:   general  EBL:     BLOOD ADMINISTERED:none  DRAINS: none   LOCAL MEDICATIONS USED:  MARCAINE     SPECIMEN:  Source of Specimen:  gallbladder and scalp cyst  DISPOSITION OF SPECIMEN:  PATHOLOGY  COUNTS:  YES  TOURNIQUET:  * No tourniquets in log *  DICTATION: .Dragon Dictation   After informed consent was obtained patient was brought to the operating room and placed in the supine position on the operating table. After adequate induction of general anesthesia the patient's abdomen was prepped with ChloraPrep, allowed to dry, and draped in usual sterile manner. An appropriate timeout was performed. The area above the umbilicus was infiltrated with quarter percent Marcaine. A small incision was made with a 15 blade knife. The incision was carried through the Subcutaneous tissue bluntly with a hemostat and [employer] retractors until the linea alba was identified. The linea alba was incised with a 15 blade knife. Each side was grasped with clamps and elevated anteriorly. The preperitoneal space was then probed bluntly with a hemostat until the peritoneum was opened and access was gained to the abdominal cavity. A 0 Vicryl pursestring stitch was placed in the fascia surrounding the opening. Hassan cannula was placed through the opening and anchored in place with the previously placed Vicryl pursestring stitch. The abdomen was insufflate with carbon dioxide without difficulty. A camera was placed through the [facility] cannula and the abdomen was generally inspected. No  obvious abnormalities were noted. The gallbladder was readily identified. The patient was placed in reverse Trendelenburg position and rotated slightly to the left. The epigastric region was then infiltrated with quarter percent Marcaine. A small incision was made with a 15 blade knife. A 5 mm port was placed bluntly through this incision into the abdominal cavity under direct vision. 2 sites were chosen laterally on the right side of the outer placement of 5 mm ports. These areas were infiltrated with quarter percent Marcaine. Small stab incisions were made with a 15 blade knife. 5 mm ports were placed bluntly through these incisions into the abdominal cavity under direct vision.  A blunt graspers placed through the lateralmost 5 mm port and used to grasp the dome of the gallbladder and elevated anteriorly and superiorly. Another blunt grasper was placed through the medial 5 mm port and used to retract on the body and neck of the gallbladder. A dissector was placed through the epigastric port and the peritoneal reflection at the base of the gallbladder was opened. Blunt dissection was carried out in this area until the gallbladder neck cystic duct junction was readily identified and a good window was created. A single clip was placed on the gallbladder neck. A small ductotomy was made just below the clip. A 14-gauge Angiocath was placed percutaneously through the anterior abdominal wall under direct vision. A Reddick cholangiocatheter was placed through the Angiocath and flushed. The Reddick catheter was then placed within the cystic duct and anchored in place with a clip. A Cholangiogram was obtained that showed no filling defects, good emptying into the duodenum, and good length on the  cystic duct. The anchoring clip and catheter were then removed from the patient. 3 clips were placed on the cystic duct and the duct was divided between the 2 sets of clips. Posterior to this the cystic artery was identified and  again dissected bluntly in a circumferential manner until a good window was created. 2 clips placed proximally on the cystic artery and one distally and the artery was divided between the 2. Next a hook cautery device was used to separate the gallbladder from the liver bed. Prior to detaching the gallbladder from the liver bed the liver bed was inspected and a couple small bleeding points were coagulated with the electrocautery until the area was completely hemostatic. The gallbladder was then detached generously from the liver bed with the hook cautery. The camera was then moved to the epigastric port and a laparoscopic bag was inserted through the [facility] cannula. The gallbladder was placed within the bag and the bag was sealed. The abdomen was then irrigated with copious amounts of saline until the effluent was clear. The gallbladder and bag were removed with the [facility] cannula through the supraumbilical port. The fascial defect was closed with the previously placed Vicryl purse string stitch as well as with a another figure-of-eight 0 Vicryl stitch. The rest of the ports were then removed under direct vision and were found to be hemostatic. The gas was allowed to escape. The incisions were then all closed with interrupted 4-0 Monocryl subcuticular stitches. Dermabond dressings were applied. At the end of the case all needle sponge and a strong counts were correct. The patient was in stable condition. The drapes were removed. Next the left scalp area was prepped with Betadine and draped in the usual sterile manner. The area around the cyst was infiltrated with quarter percent Marcaine with epinephrine. An elliptical incision was made overlying the cyst on the scalp with a 15 blade knife and the entire cyst was removed sharply. The cyst was isolated in the subcutaneous tissue and measured 2cm. Once the cyst was removed it was sent to pathology for further evaluation. Hemostasis was achieved using the Bovie  electrocautery. The incision was then closed with 4 interrupted 3-0 nylon stitches. Triple antibiotic ointment and sterile dressings were applied. The patient tolerated the procedure well. At the end of the case all needle sponge and instrument counts were correct. The patient was then awakened and taken to recovery in stable condition.  PLAN OF CARE: Discharge to home after PACU  PATIENT DISPOSITION:  PACU - hemodynamically stable.   Delay start of Pharmacological VTE agent (>24hrs) due to surgical blood loss or risk of bleeding: not applicable

## 2015-09-03 NOTE — Discharge Instructions (Signed)
Laparoscopic Cholecystectomy, Care After °Refer to this sheet in the next few weeks. These instructions provide you with information about caring for yourself after your procedure. Your health care provider may also give you more specific instructions. Your treatment has been planned according to current medical practices, but problems sometimes occur. Call your health care provider if you have any problems or questions after your procedure. °WHAT TO EXPECT AFTER THE PROCEDURE °After your procedure, it is common to have: °· Pain at your incision sites. You will be given pain medicines to control your pain. °· Mild nausea or vomiting. This should improve after the first 24 hours. °· Bloating and possible shoulder pain from the gas that was used during the procedure. This will improve after the first 24 hours. °HOME CARE INSTRUCTIONS °Incision Care °· Follow instructions from your health care provider about how to take care of your incisions. Make sure you: °¨ Wash your hands with soap and water before you change your bandage (dressing). If soap and water are not available, use hand sanitizer. °¨ Change your dressing as told by your health care provider. °¨ Leave stitches (sutures), skin glue, or adhesive strips in place. These skin closures may need to be in place for 2 weeks or longer. If adhesive strip edges start to loosen and curl up, you may trim the loose edges. Do not remove adhesive strips completely unless your health care provider tells you to do that. °· Do not take baths, swim, or use a hot tub until your health care provider approves. Ask your health care provider if you can take showers. You may only be allowed to take sponge baths for bathing. °General Instructions °· Take over-the-counter and prescription medicines only as told by your health care provider. °· Do not drive or operate heavy machinery while taking prescription pain medicine. °· Return to your normal diet as told by your health care  provider. °· Do not lift anything that is heavier than 10 lb (4.5 kg). °· Do not play contact sports for one week or until your health care provider approves. °SEEK MEDICAL CARE IF:  °· You have redness, swelling, or pain at the site of your incision. °· You have fluid, blood, or pus coming from your incision. °· You notice a bad smell coming from your incision area. °· Your surgical incisions break open. °· You have a fever. °SEEK IMMEDIATE MEDICAL CARE IF: °· You develop a rash. °· You have difficulty breathing. °· You have chest pain. °· You have increasing pain in your shoulders (shoulder strap areas). °· You faint or have dizzy episodes while you are standing. °· You have severe pain in your abdomen. °· You have nausea or vomiting that lasts for more than one day. °  °This information is not intended to replace advice given to you by your health care provider. Make sure you discuss any questions you have with your health care provider. °  °Document Released: 05/22/2005 Document Revised: 02/10/2015 Document Reviewed: 01/01/2013 °Elsevier Interactive Patient Education ©2016 [employer] °General Anesthesia, Adult, Care After °Refer to this sheet in the next few weeks. These instructions provide you with information on caring for yourself after your procedure. Your health care provider may also give you more specific instructions. Your treatment has been planned according to current medical practices, but problems sometimes occur. Call your health care provider if you have any problems or questions after your procedure. °WHAT TO EXPECT AFTER THE PROCEDURE °After the procedure, it is typical   to experience: °· Sleepiness. °· Nausea and vomiting. °HOME CARE INSTRUCTIONS °· For the first 24 hours after general anesthesia: °¨ Have a responsible person with you. °¨ Do not drive a car. If you are alone, do not take public transportation. °¨ Do not drink alcohol. °¨ Do not take medicine that has not been prescribed by  your health care provider. °¨ Do not sign important papers or make important decisions. °¨ You may resume a normal diet and activities as directed by your health care provider. °· Change bandages (dressings) as directed. °· If you have questions or problems that seem related to general anesthesia, call the hospital and ask for the anesthetist or anesthesiologist on call. °SEEK MEDICAL CARE IF: °· You have nausea and vomiting that continue the day after anesthesia. °· You develop a rash. °SEEK IMMEDIATE MEDICAL CARE IF:  °· You have difficulty breathing. °· You have chest pain. °· You have any allergic problems. °  °This information is not intended to replace advice given to you by your health care provider. Make sure you discuss any questions you have with your health care provider. °  °Document Released: 08/28/2000 Document Revised: 06/12/2014 Document Reviewed: 09/20/2011 °Elsevier Interactive Patient Education ©2016 Elsevier Inc. °. ° °

## 2015-09-03 NOTE — Anesthesia Procedure Notes (Signed)
Procedure Name: Intubation Date/Time: 09/03/2015 8:37 AM Performed by: Epimenio SarinJARVELA, Alastair Hennes R Pre-anesthesia Checklist: Patient identified, Emergency Drugs available, Suction available, Patient being monitored and Timeout performed Patient Re-evaluated:Patient Re-evaluated prior to inductionOxygen Delivery Method: Circle system utilized Preoxygenation: Pre-oxygenation with 100% oxygen Intubation Type: IV induction Ventilation: Mask ventilation without difficulty and Oral airway inserted - appropriate to patient size Laryngoscope Size: Mac and 3 Grade View: Grade I Tube type: Oral Tube size: 7.5 mm Number of attempts: 1 Airway Equipment and Method: Stylet Placement Confirmation: ETT inserted through vocal cords under direct vision,  positive ETCO2 and breath sounds checked- equal and bilateral Secured at: 21 cm Tube secured with: Tape Dental Injury: Teeth and Oropharynx as per pre-operative assessment

## 2015-09-06 ENCOUNTER — Encounter (HOSPITAL_COMMUNITY): Payer: Self-pay | Admitting: General Surgery

## 2015-10-11 IMAGING — US US RENAL
1 series · 14 of 25 positions shown · non-contrast
Comparison: Abdominal ultrasound 05/01/2008.

CLINICAL DATA: Pre pregnancy proteinuria.

EXAM:
RENAL/URINARY TRACT ULTRASOUND COMPLETE

[Series 1: us renal · 45 acquisitions, 14 frames shown]
[im 1/45]
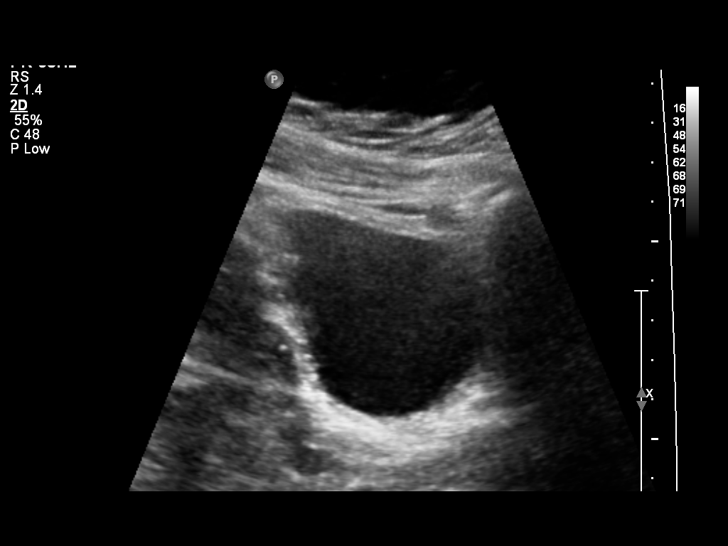
[im 4/45]
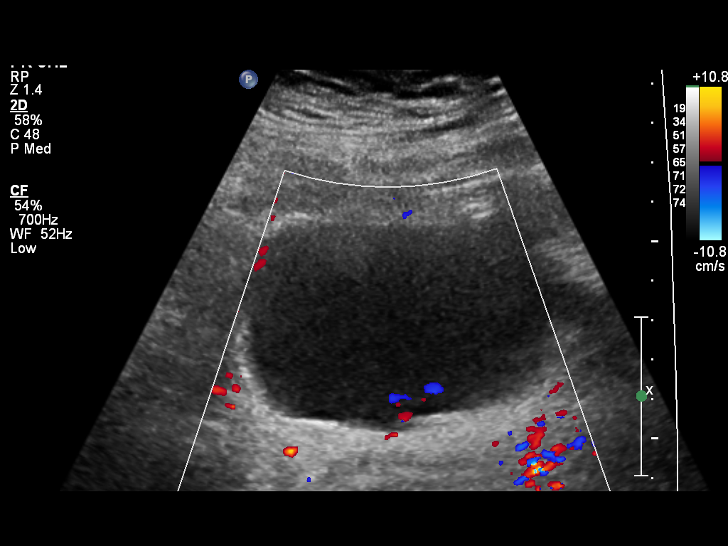
[im 8/45]
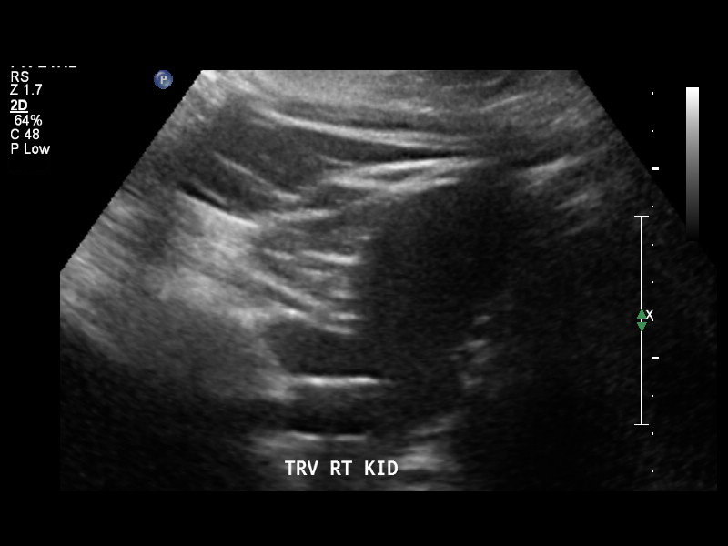
[im 12/45]
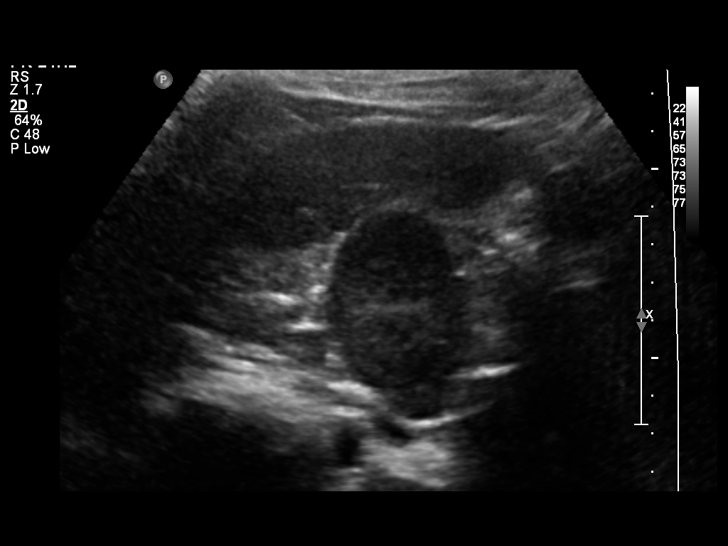
[im 15/45]
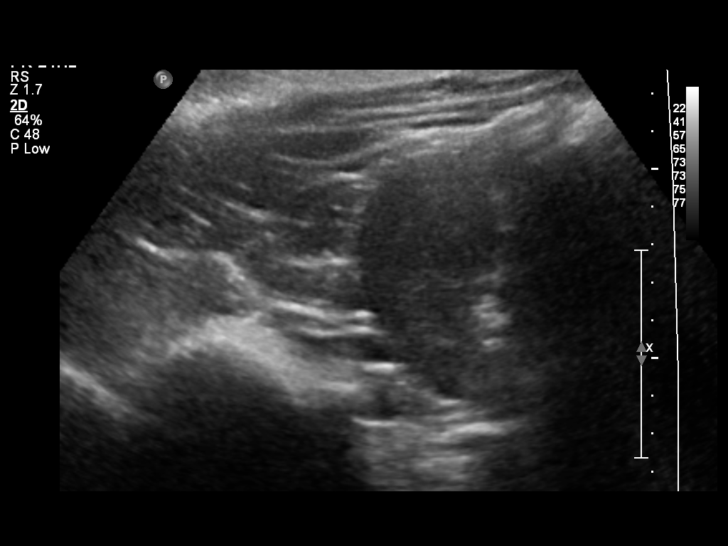
[im 17/45]
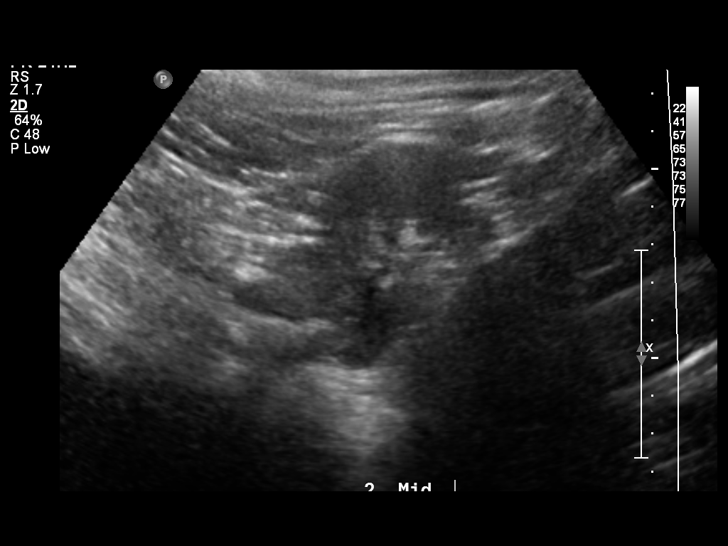
[im 21/45]
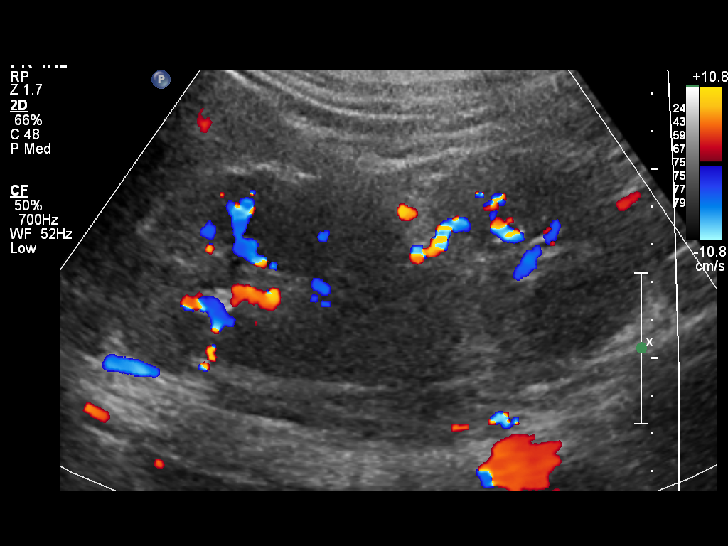
[im 24/45]
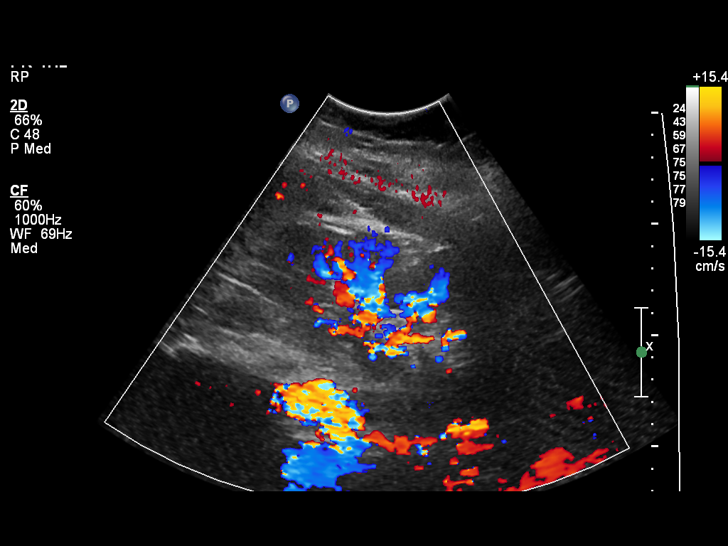
[im 28/45]
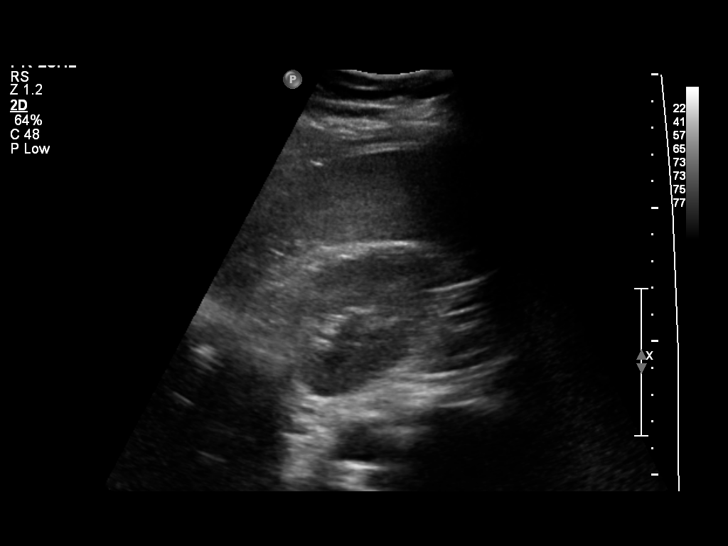
[im 30/45]
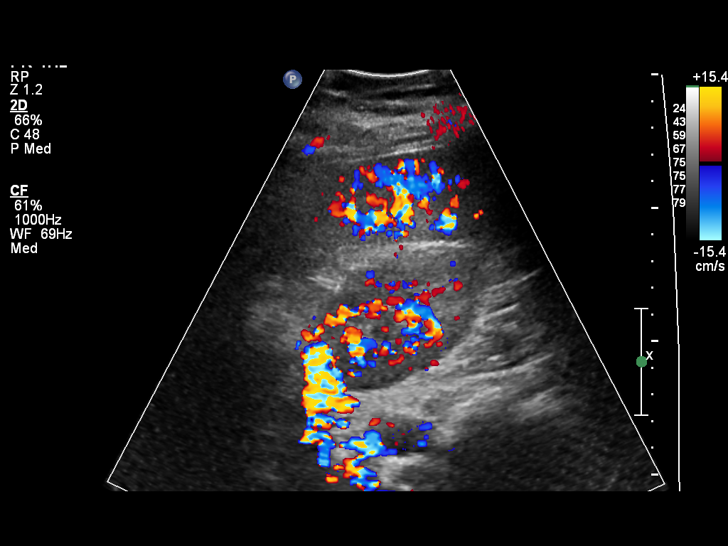
[im 34/45]
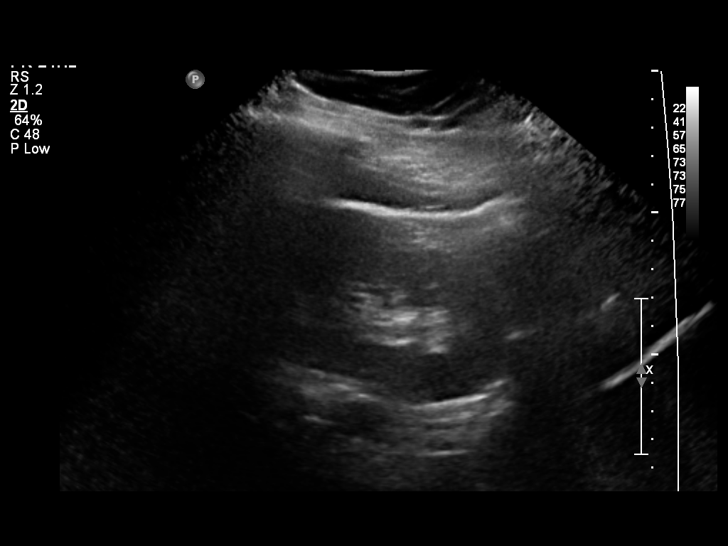
[im 37/45]
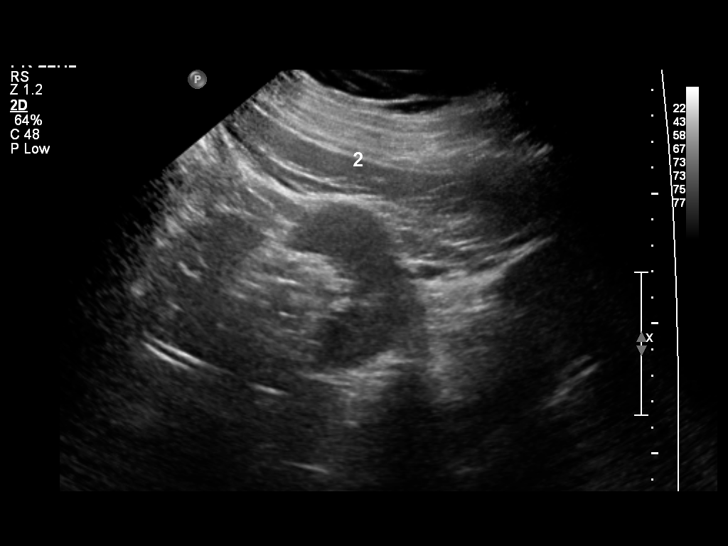
[im 41/45]
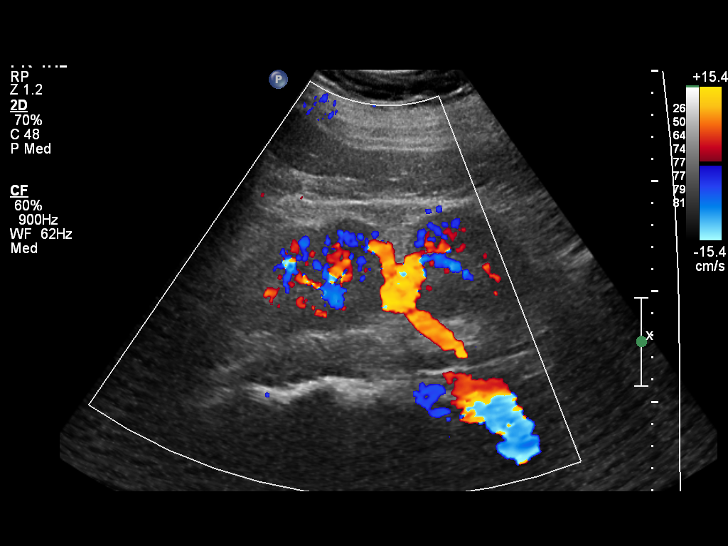
[im 45/45]
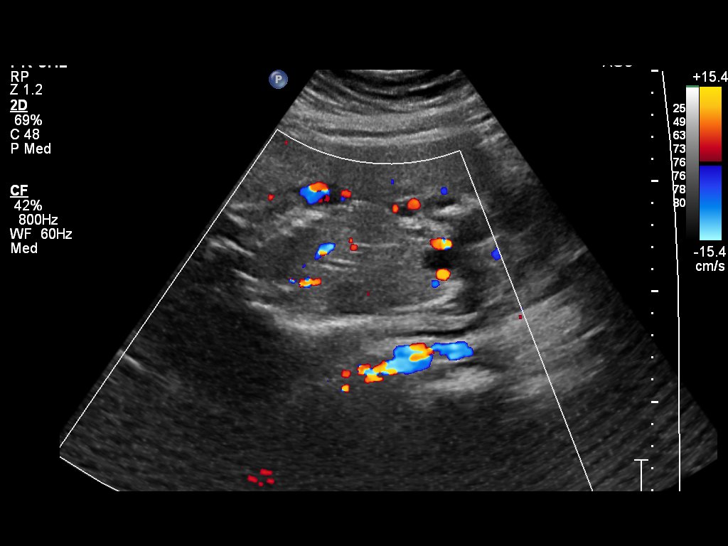

[14 of 25 positions shown; findings below may reference images not displayed]

FINDINGS: Right Kidney:

Length: 13.6 cm. Normal renal cortical thickness and echogenicity.
Duplicated renal collecting system.

Left Kidney:

Length: 13.9 cm. Echogenicity within normal limits. No mass or
hydronephrosis visualized.

Bladder:

Appears normal for degree of bladder distention.
IMPRESSION: No hydronephrosis.

Duplicated right renal collecting system.

## 2015-11-08 ENCOUNTER — Other Ambulatory Visit: Payer: Self-pay | Admitting: Obstetrics and Gynecology

## 2015-11-08 DIAGNOSIS — N63 Unspecified lump in unspecified breast: Secondary | ICD-10-CM

## 2015-11-19 ENCOUNTER — Ambulatory Visit
Admission: RE | Admit: 2015-11-19 | Discharge: 2015-11-19 | Disposition: A | Discharge status: Home / Self Care | Payer: Medicaid Other | Source: Ambulatory Visit | Attending: Obstetrics and Gynecology | Admitting: Obstetrics and Gynecology

## 2015-11-19 DIAGNOSIS — N63 Unspecified lump in unspecified breast: Secondary | ICD-10-CM

## 2016-05-31 ENCOUNTER — Other Ambulatory Visit: Payer: Self-pay | Admitting: Obstetrics and Gynecology

## 2016-05-31 DIAGNOSIS — N6320 Unspecified lump in the left breast, unspecified quadrant: Secondary | ICD-10-CM

## 2016-06-14 ENCOUNTER — Ambulatory Visit
Admission: RE | Admit: 2016-06-14 | Discharge: 2016-06-14 | Disposition: A | Discharge status: Home / Self Care | Payer: Medicaid Other | Source: Ambulatory Visit | Attending: Obstetrics and Gynecology | Admitting: Obstetrics and Gynecology

## 2016-06-14 DIAGNOSIS — N6320 Unspecified lump in the left breast, unspecified quadrant: Secondary | ICD-10-CM

## 2017-02-06 ENCOUNTER — Other Ambulatory Visit: Payer: Self-pay | Admitting: Obstetrics and Gynecology

## 2017-02-06 DIAGNOSIS — N6320 Unspecified lump in the left breast, unspecified quadrant: Secondary | ICD-10-CM

## 2017-02-13 ENCOUNTER — Ambulatory Visit
Admission: RE | Admit: 2017-02-13 | Discharge: 2017-02-13 | Disposition: A | Discharge status: Home / Self Care | Payer: Medicaid Other | Source: Ambulatory Visit | Attending: Obstetrics and Gynecology | Admitting: Obstetrics and Gynecology

## 2017-02-13 DIAGNOSIS — N6320 Unspecified lump in the left breast, unspecified quadrant: Secondary | ICD-10-CM

## 2017-12-12 ENCOUNTER — Telehealth: Payer: Self-pay

## 2017-12-12 ENCOUNTER — Telehealth: Payer: Self-pay | Admitting: Internal Medicine

## 2017-12-12 NOTE — Telephone Encounter (Signed)
error 

## 2017-12-12 NOTE — Telephone Encounter (Signed)
[facility] PA[facility] an order for an event monitor. Pt is self pay. Mailed bitel hardship application to pt.  Pt is to return application to the office with W2-form and income information.  Once we receive this info, pt will be enrolled [facility]el [facility] and application faxed to bitel.  [employer] will notify pt once review to see if she do or do not qualify for the program.

## 2018-01-27 ENCOUNTER — Other Ambulatory Visit: Payer: Self-pay

## 2018-01-27 ENCOUNTER — Emergency Department (HOSPITAL_COMMUNITY)
Admission: EM | Admit: 2018-01-27 | Discharge: 2018-01-27 | Disposition: A | Discharge status: Home / Self Care | Payer: Medicaid Other | Attending: Emergency Medicine | Admitting: Emergency Medicine

## 2018-01-27 ENCOUNTER — Encounter (HOSPITAL_COMMUNITY): Payer: Self-pay

## 2018-01-27 DIAGNOSIS — M940 Chondrocostal junction syndrome [Tietze]: Secondary | ICD-10-CM | POA: Insufficient documentation

## 2018-01-27 DIAGNOSIS — R0789 Other chest pain: Secondary | ICD-10-CM

## 2018-01-27 DIAGNOSIS — E039 Hypothyroidism, unspecified: Secondary | ICD-10-CM | POA: Insufficient documentation

## 2018-01-27 DIAGNOSIS — Z79899 Other long term (current) drug therapy: Secondary | ICD-10-CM | POA: Insufficient documentation

## 2018-01-27 DIAGNOSIS — F17210 Nicotine dependence, cigarettes, uncomplicated: Secondary | ICD-10-CM | POA: Insufficient documentation

## 2018-01-27 LAB — I-STAT TROPONIN, ED: Troponin i, poc: 0 ng/mL (ref 0.00–0.08)

## 2018-01-27 NOTE — ED Triage Notes (Addendum)
Pt reports has chest tightness "that feels like it's on the outside, like muscular".  Pain described as a burning pain when lying down, otherwise pain is aching or like a strain. Pt reports also reports pain between shoulder blades.  These symptoms started about 2-3 days ago. Pt took 1/2 of klonopin 0.5 mg prior to coming to ED
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
## 2018-01-27 NOTE — Discharge Instructions (Addendum)
Take ibuprofen 600 mg 4 times a day OR Aleve 2 tabs twice a day for pain. Use ice and heat for comfort. Recheck if you get a fever, struggle to breathe or seem worse.

## 2018-03-14 ENCOUNTER — Ambulatory Visit (INDEPENDENT_AMBULATORY_CARE_PROVIDER_SITE_OTHER): Payer: Self-pay | Admitting: Cardiology

## 2018-03-14 ENCOUNTER — Encounter: Payer: Self-pay | Admitting: Cardiology

## 2018-03-14 VITALS — BP 116/64 | HR 97 | Ht 70.0 in | Wt 262.0 lb

## 2018-03-14 DIAGNOSIS — R002 Palpitations: Secondary | ICD-10-CM
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
## 2018-03-14 NOTE — Patient Instructions (Signed)

## 2019-03-19 ENCOUNTER — Telehealth: Payer: Self-pay | Admitting: Cardiology

## 2019-03-19 NOTE — Telephone Encounter (Signed)

## 2019-04-07 ENCOUNTER — Telehealth: Payer: Self-pay | Admitting: Cardiology
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
## 2019-06-12 ENCOUNTER — Other Ambulatory Visit: Payer: Self-pay | Admitting: Obstetrics and Gynecology

## 2019-06-12 DIAGNOSIS — N63 Unspecified lump in unspecified breast: Secondary | ICD-10-CM

## 2019-06-24 ENCOUNTER — Other Ambulatory Visit: Payer: Self-pay | Admitting: Obstetrics and Gynecology

## 2019-06-24 DIAGNOSIS — N63 Unspecified lump in unspecified breast: Secondary | ICD-10-CM

## 2019-06-26 ENCOUNTER — Ambulatory Visit
Admission: RE | Admit: 2019-06-26 | Discharge: 2019-06-26 | Disposition: A | Discharge status: Home / Self Care | Payer: Self-pay | Source: Ambulatory Visit | Attending: Obstetrics and Gynecology | Admitting: Obstetrics and Gynecology

## 2019-06-26 ENCOUNTER — Other Ambulatory Visit: Payer: Self-pay

## 2019-06-26 DIAGNOSIS — N63 Unspecified lump in unspecified breast: Secondary | ICD-10-CM

## 2020-02-13 ENCOUNTER — Ambulatory Visit
Admission: EM | Admit: 2020-02-13 | Discharge: 2020-02-13 | Disposition: A | Discharge status: Home / Self Care | Payer: Self-pay | Attending: Emergency Medicine | Admitting: Emergency Medicine

## 2020-02-13 ENCOUNTER — Encounter: Payer: Self-pay | Admitting: Emergency Medicine

## 2020-02-13 ENCOUNTER — Other Ambulatory Visit: Payer: Self-pay

## 2020-02-13 DIAGNOSIS — J029 Acute pharyngitis, unspecified: Secondary | ICD-10-CM | POA: Insufficient documentation

## 2020-02-13 DIAGNOSIS — J069 Acute upper respiratory infection, unspecified: Secondary | ICD-10-CM | POA: Insufficient documentation

## 2020-02-13 DIAGNOSIS — Z1152 Encounter for screening for COVID-19: Secondary | ICD-10-CM | POA: Insufficient documentation

## 2020-02-13 HISTORY — DX: Palpitations: R00.2

## 2020-02-13 HISTORY — DX: Anxiety disorder, unspecified: F41.9

## 2020-02-13 LAB — POCT RAPID STREP A (OFFICE): Rapid Strep A Screen: NEGATIVE

## 2020-02-13 MED ORDER — FLUTICASONE PROPIONATE 50 MCG/ACT NA SUSP: 1.0000 | Freq: Every day | NASAL | 0 refills | Status: DC

## 2020-02-13 MED ORDER — DEXAMETHASONE 4 MG PO TABS: 4.0000 mg | ORAL_TABLET | Freq: Every day | ORAL | 0 refills | Status: AC

## 2020-02-13 MED ORDER — BENZONATATE 100 MG PO CAPS: 100.0000 mg | ORAL_CAPSULE | Freq: Three times a day (TID) | ORAL | 0 refills | Status: DC

## 2020-02-13 NOTE — Discharge Instructions (Addendum)
POCT strep test is negative  COVID testing ordered.  It will take between 2-7 days for test results.  Someone will contact you regarding abnormal results.    In the meantime: You should remain isolated in your home for 10 days from symptom onset AND greater than 24 hours after symptoms resolution (absence of fever without the use of fever-reducing medication and improvement in respiratory symptoms), whichever is longer Get plenty of rest and push fluids Tessalon Perles prescribed for cough Continue to take Zyrtec Flonase for nasal congestion and runny nose Decadron was prescribed Use medications daily for symptom relief Use OTC medications like ibuprofen or tylenol as needed fever or pain Call or go to the ED if you have any new or worsening symptoms such as fever, worsening cough, shortness of breath, chest tightness, chest pain, turning blue, changes in mental status, etc..[name]
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
## 2020-02-13 NOTE — ED Triage Notes (Signed)
Sore throat for past 2 days, ears are itching, sinus pressure, tickle in her throat that's making her cough

## 2020-02-16 LAB — NOVEL CORONAVIRUS, NAA: SARS-CoV-2, NAA: NOT DETECTED

## 2020-02-16 LAB — CULTURE, GROUP A STREP (THRC)

## 2020-03-08 ENCOUNTER — Ambulatory Visit: Payer: Self-pay | Admitting: Cardiology

## 2020-04-05 ENCOUNTER — Other Ambulatory Visit: Payer: Self-pay

## 2020-04-05 ENCOUNTER — Encounter: Payer: Self-pay | Admitting: Emergency Medicine

## 2020-04-05 ENCOUNTER — Ambulatory Visit
Admission: EM | Admit: 2020-04-05 | Discharge: 2020-04-05 | Disposition: A | Discharge status: Home / Self Care | Payer: Self-pay | Attending: Emergency Medicine | Admitting: Emergency Medicine

## 2020-04-05 ENCOUNTER — Telehealth: Payer: Self-pay | Admitting: Emergency Medicine

## 2020-04-05 DIAGNOSIS — J0100 Acute maxillary sinusitis, unspecified: Secondary | ICD-10-CM

## 2020-04-05 MED ORDER — PREDNISONE 10 MG PO TABS: 20.0000 mg | ORAL_TABLET | Freq: Every day | ORAL | 0 refills | Status: DC

## 2020-04-05 MED ORDER — AMOXICILLIN-POT CLAVULANATE 875-125 MG PO TABS: 1.0000 | ORAL_TABLET | Freq: Two times a day (BID) | ORAL | 0 refills | Status: DC
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
## 2020-04-05 NOTE — ED Triage Notes (Signed)
Patient states that she has sinus congestion, facial pain, sore throat x 1 week

## 2020-04-05 NOTE — Telephone Encounter (Signed)
meds were sent to wrong pharmacy

## 2020-04-05 NOTE — Discharge Instructions (Addendum)
Rest and push fluids Prednisone as prescribed.   Continue to use OTC Sudafed and Flonase as directed Augmentin prescribed.  Take as directed and to completion Continue with OTC ibuprofen/tylenol as needed for pain Follow up with PCP or [facility] if symptoms persists Return or go to the ED if you have any new or worsening symptoms such as fever, chills, worsening sinus pain/pressure, cough, sore throat, chest pain, shortness of breath, abdominal pain, changes in bowel or bladder habits,
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
## 2020-04-12 ENCOUNTER — Ambulatory Visit: Payer: Self-pay | Admitting: Physician Assistant

## 2021-01-04 ENCOUNTER — Other Ambulatory Visit: Payer: Self-pay

## 2021-01-04 ENCOUNTER — Ambulatory Visit
Admission: EM | Admit: 2021-01-04 | Discharge: 2021-01-04 | Disposition: A | Discharge status: Home / Self Care | Payer: 59 | Attending: Family Medicine | Admitting: Family Medicine

## 2021-01-04 DIAGNOSIS — B349 Viral infection, unspecified: Secondary | ICD-10-CM

## 2021-01-04 DIAGNOSIS — R059 Cough, unspecified: Secondary | ICD-10-CM

## 2021-01-04 DIAGNOSIS — Z1152 Encounter for screening for COVID-19: Secondary | ICD-10-CM

## 2021-01-04 MED ORDER — TIZANIDINE HCL 4 MG PO TABS: 4.0000 mg | ORAL_TABLET | Freq: Four times a day (QID) | ORAL | 0 refills | Status: DC | PRN

## 2021-01-04 MED ORDER — PROMETHAZINE-DM 6.25-15 MG/5ML PO SYRP: 5.0000 mL | ORAL_SOLUTION | Freq: Four times a day (QID) | ORAL | 0 refills | Status: DC | PRN

## 2021-01-04 NOTE — ED Triage Notes (Signed)
Pt states that she has a headache, facial pain and body aches.

## 2021-01-04 NOTE — ED Triage Notes (Signed)
Pt states that she has a cough and some irritation of her throat. X2 days

## 2021-01-04 NOTE — Discharge Instructions (Addendum)
Your COVID 19 results should result within 2-4 days. Negative results are immediately resulted to Mychart. Positive results will receive a follow-up call from our clinic. If symptoms are present, I recommend home quarantine until results are known.  Alternate Tylenol and ibuprofen as needed for body aches and fever.  Symptom management per recommendations discussed today.  If any breathing difficulty or chest pain develops go immediately to the closest emergency department for evaluation.  
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
## 2021-01-05 LAB — COVID-19, FLU A+B NAA
Influenza A, NAA: NOT DETECTED
Influenza B, NAA: NOT DETECTED
SARS-CoV-2, NAA: DETECTED — AB

## 2021-05-10 ENCOUNTER — Other Ambulatory Visit (HOSPITAL_COMMUNITY): Payer: Self-pay

## 2021-05-10 MED ORDER — MOUNJARO 5 MG/0.5ML ~~LOC~~ SOAJ
5.0000 mg | SUBCUTANEOUS | 3 refills | Status: DC
  Filled 2021-05-10 – 2021-05-31 (×3): qty 2, 28d supply, fill #0
  Filled 2021-06-14 – 2021-06-28 (×2): qty 2, 28d supply, fill #1

## 2021-05-12 IMAGING — US US BREAST*L* LIMITED INC AXILLA
1 series · 4 of 4 positions shown · non-contrast
Comparison: Previous exam(s).

CLINICAL DATA: Follow-up probably benign lipoma in the 10 o'clock
position of the left breast. Family history of breast cancer in a
paternal aunt.

EXAM:
ULTRASOUND OF THE LEFT BREAST

[Series 1: us breast*left* limited inc axilla · 0.06mm/px · 4 of 4 slices shown]
[im 1/4]
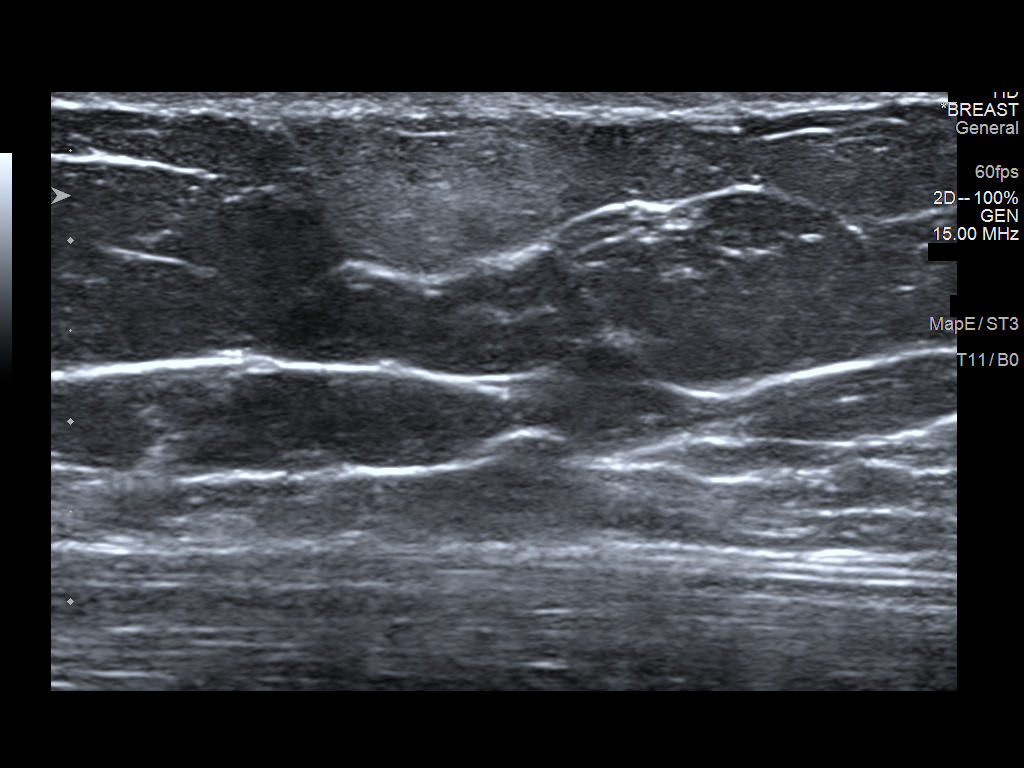
[im 2/4]
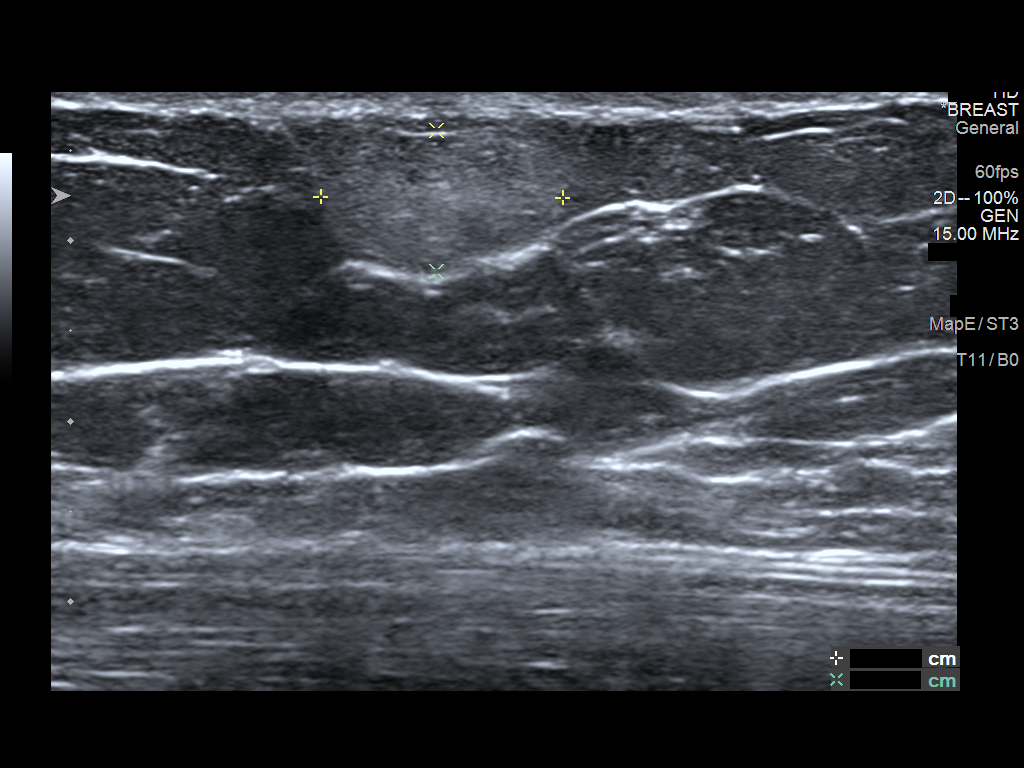
[im 3/4]
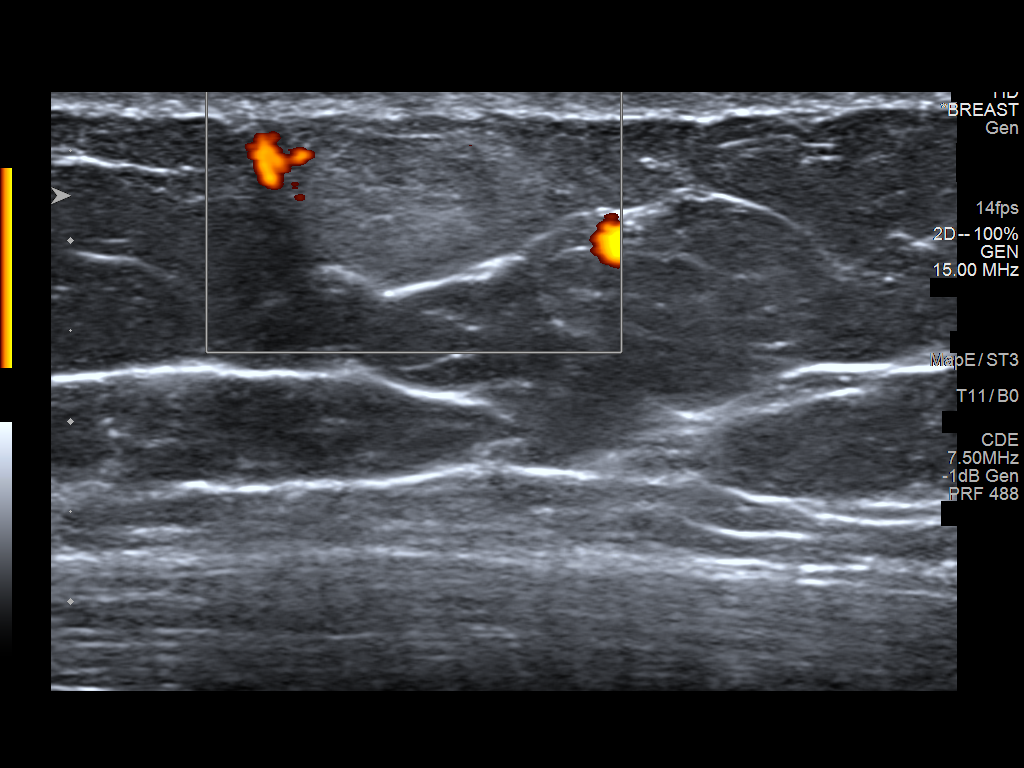
[im 4/4]
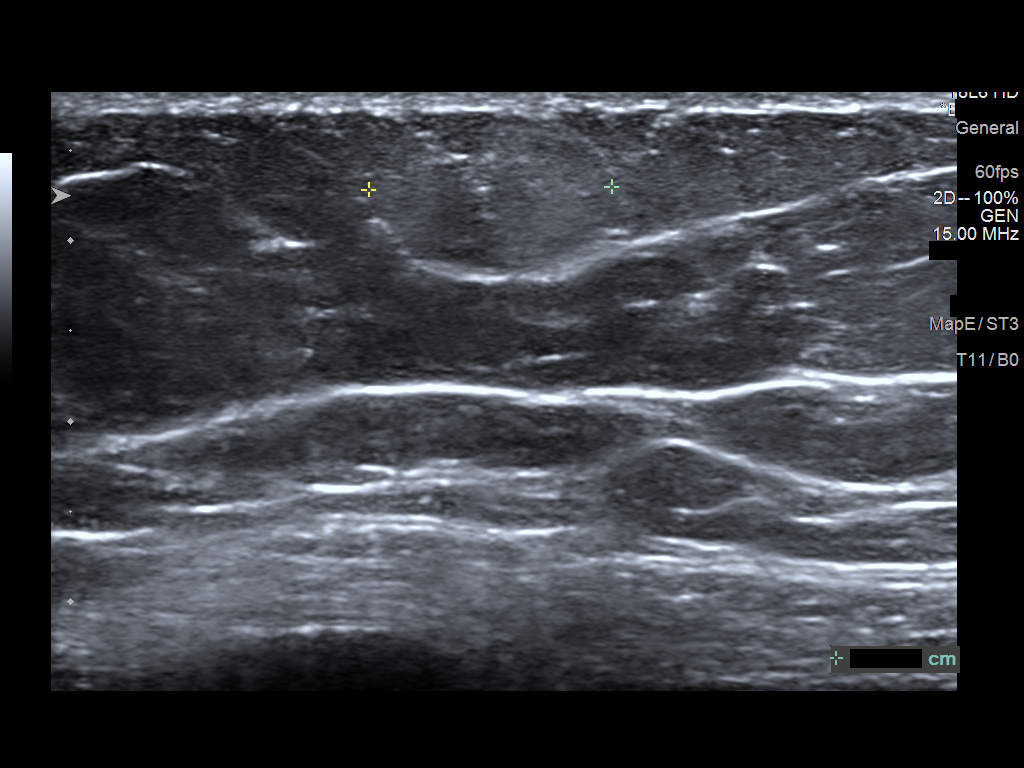

[4 of 4 positions shown; findings below may reference images not displayed]

FINDINGS: On physical exam, the patient has an approximately 1.2 cm oval,
circumscribed, faintly palpable mass in the 10 o'clock position of
the left breast, 10 cm from the nipple. This is somewhat
compressible to palpation. There was corresponding fat density on
the patient's previous mammogram dated 08/30/2015.

Targeted ultrasound is performed, showing a 1.4 x 1.3 x 0.8 cm oval,
horizontally oriented, mildly heterogeneous and mildly echogenic
mass within a subcutaneous fat lobule in the 10 o'clock position of
the left breast, 10 cm from the nipple. This has some circumscribed
and some indistinct margins. No internal blood flow was seen with
power Doppler. This measured 1.3 x 1.3 x 1.0 cm on the most recent
comparison examination dated 02/13/2017 and measured 1.3 x 1.2 x
cm on the initial examination dated 08/30/2015.
IMPRESSION: Stable benign lipoma in the 10 o'clock position of the left breast.
No evidence of malignancy.

RECOMMENDATION:
Annual screening mammography beginning at age 40.

I have discussed the findings and recommendations with the patient.
If applicable, a reminder letter will be sent to the patient
regarding the next appointment.

BI-RADS CATEGORY  2: Benign.

## 2021-05-31 ENCOUNTER — Other Ambulatory Visit (HOSPITAL_COMMUNITY): Payer: Self-pay

## 2021-06-14 ENCOUNTER — Other Ambulatory Visit (HOSPITAL_COMMUNITY): Payer: Self-pay

## 2021-06-28 ENCOUNTER — Other Ambulatory Visit (HOSPITAL_COMMUNITY): Payer: Self-pay

## 2021-07-20 ENCOUNTER — Other Ambulatory Visit (HOSPITAL_COMMUNITY): Payer: Self-pay

## 2021-12-30 ENCOUNTER — Emergency Department (HOSPITAL_BASED_OUTPATIENT_CLINIC_OR_DEPARTMENT_OTHER)
Admission: EM | Admit: 2021-12-30 | Discharge: 2021-12-30 | Disposition: A | Discharge status: Home / Self Care | Payer: 59 | Attending: Emergency Medicine | Admitting: Emergency Medicine

## 2021-12-30 ENCOUNTER — Other Ambulatory Visit: Payer: Self-pay

## 2021-12-30 ENCOUNTER — Encounter (HOSPITAL_BASED_OUTPATIENT_CLINIC_OR_DEPARTMENT_OTHER): Payer: Self-pay

## 2021-12-30 DIAGNOSIS — N8110 Cystocele, unspecified: Secondary | ICD-10-CM | POA: Diagnosis present

## 2021-12-30 LAB — URINALYSIS, ROUTINE W REFLEX MICROSCOPIC
Bilirubin Urine: NEGATIVE
Glucose, UA: NEGATIVE mg/dL
Hgb urine dipstick: NEGATIVE
Ketones, ur: NEGATIVE mg/dL
Leukocytes,Ua: NEGATIVE
Nitrite: NEGATIVE
Protein, ur: 30 mg/dL — AB
Specific Gravity, Urine: 1.025 (ref 1.005–1.030)
pH: 5.5 (ref 5.0–8.0)

## 2021-12-30 LAB — URINALYSIS, MICROSCOPIC (REFLEX)

## 2021-12-30 NOTE — ED Triage Notes (Signed)
Patient believes she has a prolapsed uterus - Patient c/o uterine pressure x Wednesday. Unable to get in with OB or PCP.

## 2021-12-30 NOTE — Discharge Instructions (Signed)
Follow-up with your OB/GYN.  They will hopefully be able to refer you to a pelvic floor therapist if needed.  It was a pleasure to meet you and I hope that you feel better.

## 2021-12-30 NOTE — ED Notes (Signed)
Dc instructions reviewed with pt no questions or concerns at this time. Will follow up with OBGYN.  

## 2021-12-30 NOTE — ED Provider Notes (Signed)
[facility] EMERGENCY DEPARTMENT Provider Note   CSN: [id number] Arrival date & time: 12/30/21  1117     History  Chief Complaint  Patient presents with   Vaginal Prolapse    Kelly Kim is a 36 y.o. female with no known medical history presenting today with a complaint of vaginal pressure and feeling as though her "uterus is falling out of her vagina."  Denies any discharge or vaginal odor.  She has an OB/GYN who she tried to get in contact with but they told her they would not see her and that she needed to come to the emergency department.  Is sexually active, last intercourse yesterday.  Says that it was not painful and that her female sexual partner did not notice anything abnormal but she feels pressure.  Worse when she coughs or sneezes.  No dysuria or hematuria but does feel like sometimes she incompletely voids.  Notes having to vaginal births. HPI     Home Medications Prior to Admission medications   Medication Sig Start Date End Date Taking? Authorizing Provider  amoxicillin-clavulanate (AUGMENTIN) 875-125 MG tablet Take 1 tablet by mouth every 12 (twelve) hours. 04/05/20   Avegno, Zachery Dakins, FNP  amoxicillin-clavulanate (AUGMENTIN) 875-125 MG tablet Take 1 tablet by mouth every 12 (twelve) hours. 04/05/20   Avegno, Zachery Dakins, FNP  benzonatate (TESSALON) 100 MG capsule Take 1 capsule (100 mg total) by mouth every 8 (eight) hours. 02/13/20   Avegno, Zachery Dakins, FNP  clonazePAM (KLONOPIN) 0.5 MG tablet Take 0.5 mg by mouth daily as needed for anxiety.     [provider]  escitalopram (LEXAPRO) 10 MG tablet Take 10 mg by mouth daily.    [provider]  fluticasone (FLONASE) 50 MCG/ACT nasal spray Place 1 spray into both nostrils daily for 14 days. 02/13/20 02/27/20  Avegno, Zachery Dakins, FNP  predniSONE (DELTASONE) 10 MG tablet Take 2 tablets (20 mg total) by mouth daily. 04/05/20   Avegno, Zachery Dakins, FNP  predniSONE (DELTASONE) 10 MG tablet Take 2  tablets (20 mg total) by mouth daily. 04/05/20   Avegno, Zachery Dakins, FNP  Probiotic Product (PROBIOTIC DAILY PO) Take 1 tablet by mouth daily.    [provider]  promethazine-dextromethorphan (PROMETHAZINE-DM) 6.25-15 MG/5ML syrup Take 5 mLs by mouth 4 (four) times daily as needed for cough. 01/04/21   Bing Neighbors, FNP  thyroid (ARMOUR) 15 MG tablet Take 15 mg by mouth daily.    [provider]  tirzepatide Greggory Keen) 5 MG/0.5ML Pen Inject 5 mg into the skin once a week. 05/10/21     tiZANidine (ZANAFLEX) 4 MG tablet Take 1 tablet (4 mg total) by mouth every 6 (six) hours as needed for muscle spasms. 01/04/21   Bing Neighbors, FNP      Allergies    Patient has no known allergies.    Review of Systems   Review of Systems  Physical Exam Updated Vital Signs BP 127/82   Pulse (!) 108   Temp 98.2 F (36.8 C) (Oral)   Resp 16   Ht 5\' 10"  (1.778 m)   Wt 107 kg   LMP 12/20/2021   SpO2 99%   BMI 33.86 kg/m  Physical Exam Vitals and nursing note reviewed.  Constitutional:      Appearance: Normal appearance.  HENT:     Head: Normocephalic and atraumatic.  Eyes:     General: No scleral icterus.    Conjunctiva/sclera: Conjunctivae normal.  Pulmonary:  Effort: Pulmonary effort is normal. No respiratory distress.  Genitourinary:    Comments: GU exam performed in presence of RN.  There is no sign of uterine prolapse.  Cervix is within the vaginal vault.  Does appear to have a vaginal wall prolapse.  Normal vaginal discharge in the canal.  Nontender bimanual Skin:    Findings: No rash.  Neurological:     Mental Status: She is alert.  Psychiatric:        Mood and Affect: Mood normal.     ED Results / Procedures / Treatments   Labs (all labs ordered are listed, but only abnormal results are displayed) Labs Reviewed  URINALYSIS, ROUTINE W REFLEX MICROSCOPIC - Abnormal; Notable for the following components:      Result Value   Protein, ur 30 (*)    All  other components within normal limits  URINALYSIS, MICROSCOPIC (REFLEX) - Abnormal; Notable for the following components:   Bacteria, UA MANY (*)    All other components within normal limits    EKG None  Radiology No results found.  Procedures Procedures   Medications Ordered in ED Medications - No data to display  ED Course/ Medical Decision Making/ A&P                           Medical Decision Making Amount and/or Complexity of Data Reviewed Labs: ordered.   36 year old female presenting with concern for a uterine prolapse.  Tried to see OB/GYN who sent her here.  She is not having large amounts of pain, just describes it as pressure.  Physical exam was performed with RN.  There did not appear to be a uterine prolapse, cervix appeared in the vaginal vault.  There did appear to be a vaginal wall prolapse which may be the pressure that the patient is feeling.  No signs or symptoms of fistula.  Work-up: Urine sample is with some bacteria but no leukocytes or nitrates.  Suspect contaminated sample.  MDM/disposition: Patient does not have any signs of emergent condition needing further intervention in the department today.  I suspect her feeling of incomplete voiding may be secondary to a vaginal wall prolapse causing pressure on her urethra.  Currently she is not having any pain and her prolapse is reducible.  She needs to follow-up with OB/GYN and potentially be referred to pelvic floor therapist.  She is agreeable to this plan.     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 Return precautions discussed in full. Patient had no additional questions and expressed complete understanding.   This chart was dictated using voice recognition software.  Despite best efforts to proofread,  errors can occur  which can change the documentation meaning.    Woodroe Chen 12/30/21 1850    Milagros Loll, MD 12/31/21 [phone]

## 2022-05-22 DIAGNOSIS — E669 Obesity, unspecified: Secondary | ICD-10-CM | POA: Diagnosis not present

## 2022-05-22 DIAGNOSIS — R7301 Impaired fasting glucose: Secondary | ICD-10-CM | POA: Diagnosis not present

## 2022-05-22 DIAGNOSIS — R69 Illness, unspecified: Secondary | ICD-10-CM | POA: Diagnosis not present

## 2022-06-07 DIAGNOSIS — R7301 Impaired fasting glucose: Secondary | ICD-10-CM | POA: Diagnosis not present

## 2022-06-07 DIAGNOSIS — E669 Obesity, unspecified: Secondary | ICD-10-CM | POA: Diagnosis not present

## 2022-06-29 DIAGNOSIS — R002 Palpitations: Secondary | ICD-10-CM | POA: Diagnosis not present

## 2022-06-29 DIAGNOSIS — R7301 Impaired fasting glucose: Secondary | ICD-10-CM | POA: Diagnosis not present

## 2022-06-29 DIAGNOSIS — E039 Hypothyroidism, unspecified: Secondary | ICD-10-CM | POA: Diagnosis not present

## 2022-07-06 DIAGNOSIS — R82998 Other abnormal findings in urine: Secondary | ICD-10-CM | POA: Diagnosis not present

## 2022-07-13 DIAGNOSIS — R0981 Nasal congestion: Secondary | ICD-10-CM | POA: Diagnosis not present

## 2022-07-13 DIAGNOSIS — U071 COVID-19: Secondary | ICD-10-CM | POA: Diagnosis not present

## 2022-07-13 DIAGNOSIS — R638 Other symptoms and signs concerning food and fluid intake: Secondary | ICD-10-CM | POA: Diagnosis not present

## 2022-07-13 DIAGNOSIS — G43909 Migraine, unspecified, not intractable, without status migrainosus: Secondary | ICD-10-CM | POA: Diagnosis not present

## 2022-07-13 DIAGNOSIS — R0602 Shortness of breath: Secondary | ICD-10-CM | POA: Diagnosis not present

## 2022-07-13 DIAGNOSIS — Z1152 Encounter for screening for COVID-19: Secondary | ICD-10-CM | POA: Diagnosis not present

## 2022-07-13 DIAGNOSIS — R5383 Other fatigue: Secondary | ICD-10-CM | POA: Diagnosis not present

## 2022-09-01 ENCOUNTER — Other Ambulatory Visit: Payer: Self-pay

## 2022-09-01 ENCOUNTER — Ambulatory Visit
Admission: EM | Admit: 2022-09-01 | Discharge: 2022-09-01 | Disposition: A | Discharge status: Home / Self Care | Payer: 59 | Attending: Family Medicine | Admitting: Family Medicine

## 2022-09-01 ENCOUNTER — Encounter: Payer: Self-pay | Admitting: Emergency Medicine

## 2022-09-01 DIAGNOSIS — J069 Acute upper respiratory infection, unspecified: Secondary | ICD-10-CM | POA: Diagnosis not present

## 2022-09-01 LAB — POCT RAPID STREP A (OFFICE): Rapid Strep A Screen: NEGATIVE

## 2022-09-01 MED ORDER — PSEUDOEPHEDRINE HCL 60 MG PO TABS: 60.0000 mg | ORAL_TABLET | Freq: Three times a day (TID) | ORAL | 0 refills | Status: DC | PRN

## 2022-09-01 MED ORDER — FLUTICASONE PROPIONATE 50 MCG/ACT NA SUSP: 1.0000 | Freq: Two times a day (BID) | NASAL | 2 refills | Status: DC

## 2022-09-01 MED ORDER — PROMETHAZINE-DM 6.25-15 MG/5ML PO SYRP: 5.0000 mL | ORAL_SOLUTION | Freq: Four times a day (QID) | ORAL | 0 refills | Status: DC | PRN

## 2022-09-01 NOTE — ED Triage Notes (Addendum)
Pt reports sore throat, drainage, cough, bilateral "rib cage" pain, right ear pain x1 week. Pt daughter tested positive for strep yesterday and would like to be checked for that as well.
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
## 2022-09-01 NOTE — ED Triage Notes (Signed)
Discharged by [place] L.CMA

## 2022-09-03 LAB — CULTURE, GROUP A STREP (THRC)

## 2022-10-04 DIAGNOSIS — M79632 Pain in left forearm: Secondary | ICD-10-CM | POA: Diagnosis not present

## 2022-10-04 DIAGNOSIS — M25522 Pain in left elbow: Secondary | ICD-10-CM | POA: Diagnosis not present

## 2022-10-04 DIAGNOSIS — M79642 Pain in left hand: Secondary | ICD-10-CM | POA: Diagnosis not present

## 2022-10-24 DIAGNOSIS — J0100 Acute maxillary sinusitis, unspecified: Secondary | ICD-10-CM | POA: Diagnosis not present

## 2022-10-24 DIAGNOSIS — R0981 Nasal congestion: Secondary | ICD-10-CM | POA: Diagnosis not present

## 2022-10-24 DIAGNOSIS — H938X3 Other specified disorders of ear, bilateral: Secondary | ICD-10-CM | POA: Diagnosis not present

## 2022-10-24 DIAGNOSIS — H6993 Unspecified Eustachian tube disorder, bilateral: Secondary | ICD-10-CM | POA: Diagnosis not present

## 2023-01-06 DIAGNOSIS — R300 Dysuria: Secondary | ICD-10-CM | POA: Diagnosis not present

## 2023-01-31 DIAGNOSIS — F329 Major depressive disorder, single episode, unspecified: Secondary | ICD-10-CM | POA: Diagnosis not present

## 2023-01-31 DIAGNOSIS — F419 Anxiety disorder, unspecified: Secondary | ICD-10-CM | POA: Diagnosis not present

## 2023-02-07 DIAGNOSIS — F329 Major depressive disorder, single episode, unspecified: Secondary | ICD-10-CM | POA: Diagnosis not present

## 2023-02-07 DIAGNOSIS — F419 Anxiety disorder, unspecified: Secondary | ICD-10-CM | POA: Diagnosis not present

## 2023-02-09 DIAGNOSIS — E039 Hypothyroidism, unspecified: Secondary | ICD-10-CM | POA: Diagnosis not present

## 2023-02-09 DIAGNOSIS — K582 Mixed irritable bowel syndrome: Secondary | ICD-10-CM | POA: Diagnosis not present

## 2023-02-09 DIAGNOSIS — E669 Obesity, unspecified: Secondary | ICD-10-CM | POA: Diagnosis not present

## 2023-02-09 DIAGNOSIS — F419 Anxiety disorder, unspecified: Secondary | ICD-10-CM | POA: Diagnosis not present

## 2023-02-12 DIAGNOSIS — Z0000 Encounter for general adult medical examination without abnormal findings: Secondary | ICD-10-CM | POA: Diagnosis not present

## 2023-02-14 DIAGNOSIS — F32A Depression, unspecified: Secondary | ICD-10-CM | POA: Diagnosis not present

## 2023-02-14 DIAGNOSIS — F419 Anxiety disorder, unspecified: Secondary | ICD-10-CM | POA: Diagnosis not present

## 2023-02-14 DIAGNOSIS — F39 Unspecified mood [affective] disorder: Secondary | ICD-10-CM | POA: Diagnosis not present

## 2023-02-20 DIAGNOSIS — F419 Anxiety disorder, unspecified: Secondary | ICD-10-CM | POA: Diagnosis not present

## 2023-02-20 DIAGNOSIS — F39 Unspecified mood [affective] disorder: Secondary | ICD-10-CM | POA: Diagnosis not present

## 2023-03-01 DIAGNOSIS — F39 Unspecified mood [affective] disorder: Secondary | ICD-10-CM | POA: Diagnosis not present

## 2023-03-01 DIAGNOSIS — F32A Depression, unspecified: Secondary | ICD-10-CM | POA: Diagnosis not present

## 2023-03-01 DIAGNOSIS — F419 Anxiety disorder, unspecified: Secondary | ICD-10-CM | POA: Diagnosis not present

## 2023-03-06 DIAGNOSIS — F32A Depression, unspecified: Secondary | ICD-10-CM | POA: Diagnosis not present

## 2023-03-06 DIAGNOSIS — F419 Anxiety disorder, unspecified: Secondary | ICD-10-CM | POA: Diagnosis not present

## 2023-03-06 DIAGNOSIS — F39 Unspecified mood [affective] disorder: Secondary | ICD-10-CM | POA: Diagnosis not present

## 2023-03-21 DIAGNOSIS — F32A Depression, unspecified: Secondary | ICD-10-CM | POA: Diagnosis not present

## 2023-03-21 DIAGNOSIS — F411 Generalized anxiety disorder: Secondary | ICD-10-CM | POA: Diagnosis not present

## 2023-03-21 DIAGNOSIS — Z1331 Encounter for screening for depression: Secondary | ICD-10-CM | POA: Diagnosis not present

## 2023-03-21 DIAGNOSIS — Z79899 Other long term (current) drug therapy: Secondary | ICD-10-CM | POA: Diagnosis not present

## 2023-03-21 DIAGNOSIS — F909 Attention-deficit hyperactivity disorder, unspecified type: Secondary | ICD-10-CM | POA: Diagnosis not present

## 2023-03-22 DIAGNOSIS — F32A Depression, unspecified: Secondary | ICD-10-CM | POA: Diagnosis not present

## 2023-03-22 DIAGNOSIS — F419 Anxiety disorder, unspecified: Secondary | ICD-10-CM | POA: Diagnosis not present

## 2023-03-22 DIAGNOSIS — F39 Unspecified mood [affective] disorder: Secondary | ICD-10-CM | POA: Diagnosis not present

## 2023-03-23 DIAGNOSIS — M792 Neuralgia and neuritis, unspecified: Secondary | ICD-10-CM | POA: Diagnosis not present

## 2023-03-23 DIAGNOSIS — M5481 Occipital neuralgia: Secondary | ICD-10-CM | POA: Diagnosis not present

## 2023-03-23 DIAGNOSIS — G43909 Migraine, unspecified, not intractable, without status migrainosus: Secondary | ICD-10-CM | POA: Diagnosis not present

## 2023-03-27 DIAGNOSIS — M5481 Occipital neuralgia: Secondary | ICD-10-CM | POA: Diagnosis not present

## 2023-03-27 DIAGNOSIS — G43909 Migraine, unspecified, not intractable, without status migrainosus: Secondary | ICD-10-CM | POA: Diagnosis not present

## 2023-03-27 DIAGNOSIS — Z23 Encounter for immunization: Secondary | ICD-10-CM | POA: Diagnosis not present

## 2023-03-27 DIAGNOSIS — Z021 Encounter for pre-employment examination: Secondary | ICD-10-CM | POA: Diagnosis not present

## 2023-03-27 DIAGNOSIS — F17219 Nicotine dependence, cigarettes, with unspecified nicotine-induced disorders: Secondary | ICD-10-CM | POA: Diagnosis not present

## 2023-03-27 DIAGNOSIS — M792 Neuralgia and neuritis, unspecified: Secondary | ICD-10-CM | POA: Diagnosis not present

## 2023-03-27 DIAGNOSIS — Z111 Encounter for screening for respiratory tuberculosis: Secondary | ICD-10-CM | POA: Diagnosis not present

## 2023-04-17 ENCOUNTER — Ambulatory Visit: Payer: Medicaid Other | Admitting: Neurology

## 2023-04-25 DIAGNOSIS — F900 Attention-deficit hyperactivity disorder, predominantly inattentive type: Secondary | ICD-10-CM | POA: Diagnosis not present

## 2023-04-25 DIAGNOSIS — F411 Generalized anxiety disorder: Secondary | ICD-10-CM | POA: Diagnosis not present

## 2023-04-25 DIAGNOSIS — F32A Depression, unspecified: Secondary | ICD-10-CM | POA: Diagnosis not present

## 2023-05-17 ENCOUNTER — Telehealth: Payer: Self-pay | Admitting: Neurology

## 2023-05-17 ENCOUNTER — Ambulatory Visit: Payer: Medicaid Other | Admitting: Neurology

## 2023-05-17 ENCOUNTER — Encounter: Payer: Self-pay | Admitting: Neurology

## 2023-05-17 VITALS — BP 136/94 | HR 87 | Ht 70.0 in | Wt 277.0 lb

## 2023-05-17 DIAGNOSIS — G43709 Chronic migraine without aura, not intractable, without status migrainosus: Secondary | ICD-10-CM | POA: Diagnosis not present

## 2023-05-17 HISTORY — DX: Chronic migraine without aura, not intractable, without status migrainosus: G43.709

## 2023-05-17 MED ORDER — GABAPENTIN 100 MG PO CAPS: 100.0000 mg | ORAL_CAPSULE | Freq: Three times a day (TID) | ORAL | 5 refills | Status: DC | PRN

## 2023-05-17 MED ORDER — SUMATRIPTAN SUCCINATE 50 MG PO TABS: 50.0000 mg | ORAL_TABLET | ORAL | 5 refills | Status: DC | PRN

## 2023-05-17 NOTE — Patient Instructions (Addendum)
Topamax.  Meds ordered this encounter  Medications   gabapentin (NEURONTIN) 100 MG capsule    Sig: Take 1 capsule (100 mg total) by mouth 3 (three) times daily as needed.    Dispense:  90 capsule    Refill:  5   SUMAtriptan (IMITREX) 50 MG tablet    Sig: Take 1 tablet (50 mg total) by mouth every 2 (two) hours as needed for migraine. May repeat in 2 hours if headache persists or recurs.    Dispense:  10 tablet    Refill:  5
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
## 2023-05-17 NOTE — Telephone Encounter (Signed)
Pt would like medications from today be sent to [facility] at [address] [place], [place] [address]

## 2023-05-17 NOTE — Telephone Encounter (Signed)
Lmtrc to clarify which meds needs to be refilled

## 2023-05-25 DIAGNOSIS — F411 Generalized anxiety disorder: Secondary | ICD-10-CM | POA: Diagnosis not present

## 2023-05-25 DIAGNOSIS — F909 Attention-deficit hyperactivity disorder, unspecified type: Secondary | ICD-10-CM | POA: Diagnosis not present

## 2023-05-25 DIAGNOSIS — F32A Depression, unspecified: Secondary | ICD-10-CM | POA: Diagnosis not present

## 2023-05-30 ENCOUNTER — Other Ambulatory Visit: Payer: Self-pay

## 2023-05-30 ENCOUNTER — Emergency Department (HOSPITAL_COMMUNITY): Payer: Medicaid Other

## 2023-05-30 ENCOUNTER — Emergency Department (HOSPITAL_COMMUNITY)
Admission: EM | Admit: 2023-05-30 | Discharge: 2023-05-30 | Disposition: A | Discharge status: Home / Self Care | Payer: Medicaid Other | Attending: Emergency Medicine | Admitting: Emergency Medicine

## 2023-05-30 ENCOUNTER — Encounter (HOSPITAL_COMMUNITY): Payer: Self-pay

## 2023-05-30 DIAGNOSIS — Z1152 Encounter for screening for COVID-19: Secondary | ICD-10-CM | POA: Diagnosis not present

## 2023-05-30 DIAGNOSIS — E039 Hypothyroidism, unspecified: Secondary | ICD-10-CM | POA: Diagnosis not present

## 2023-05-30 DIAGNOSIS — R1013 Epigastric pain: Secondary | ICD-10-CM | POA: Diagnosis not present

## 2023-05-30 DIAGNOSIS — R0602 Shortness of breath: Secondary | ICD-10-CM | POA: Insufficient documentation

## 2023-05-30 DIAGNOSIS — R002 Palpitations: Secondary | ICD-10-CM | POA: Diagnosis not present

## 2023-05-30 LAB — CBC
HCT: 37.5 % (ref 36.0–46.0)
Hemoglobin: 12.6 g/dL (ref 12.0–15.0)
MCH: 30 pg (ref 26.0–34.0)
MCHC: 33.6 g/dL (ref 30.0–36.0)
MCV: 89.3 fL (ref 80.0–100.0)
Platelets: 331 10*3/uL (ref 150–400)
RBC: 4.2 MIL/uL (ref 3.87–5.11)
RDW: 12.5 % (ref 11.5–15.5)
WBC: 8 10*3/uL (ref 4.0–10.5)
nRBC: 0 % (ref 0.0–0.2)

## 2023-05-30 LAB — COMPREHENSIVE METABOLIC PANEL
ALT: 24 U/L (ref 0–44)
AST: 16 U/L (ref 15–41)
Albumin: 3.8 g/dL (ref 3.5–5.0)
Alkaline Phosphatase: 56 U/L (ref 38–126)
Anion gap: 8 (ref 5–15)
BUN: 13 mg/dL (ref 6–20)
CO2: 26 mmol/L (ref 22–32)
Calcium: 9.3 mg/dL (ref 8.9–10.3)
Chloride: 103 mmol/L (ref 98–111)
Creatinine, Ser: 0.73 mg/dL (ref 0.44–1.00)
GFR, Estimated: >60 mL/min (ref >60)
Glucose, Bld: 96 mg/dL (ref 70–99)
Potassium: 3.8 mmol/L (ref 3.5–5.1)
Sodium: 137 mmol/L (ref 135–145)
Total Bilirubin: 0.3 mg/dL (ref <1.2)
Total Protein: 6.9 g/dL (ref 6.5–8.1)

## 2023-05-30 LAB — RESP PANEL BY RT-PCR (RSV, FLU A&B, COVID)  RVPGX2
Influenza A by PCR: NEGATIVE
Influenza B by PCR: NEGATIVE
Resp Syncytial Virus by PCR: NEGATIVE
SARS Coronavirus 2 by RT PCR: NEGATIVE

## 2023-05-30 LAB — TSH: TSH: 1.492 u[IU]/mL (ref 0.350–4.500)

## 2023-05-30 LAB — TROPONIN I (HIGH SENSITIVITY): Troponin I (High Sensitivity): 2 ng/L (ref <18)

## 2023-05-30 MED ORDER — ALUM & MAG HYDROXIDE-SIMETH 200-200-20 MG/5ML PO SUSP
30.0000 mL | Freq: Once | ORAL | Status: AC
  Administered 2023-05-30: 30 mL via ORAL
  Filled 2023-05-30: qty 30

## 2023-05-30 MED ORDER — LIDOCAINE VISCOUS HCL 2 % MT SOLN
15.0000 mL | Freq: Once | OROMUCOSAL | Status: AC
  Administered 2023-05-30: 15 mL via ORAL
  Filled 2023-05-30: qty 15

## 2023-05-30 NOTE — Discharge Instructions (Addendum)
Your workup today is reassuring. It is very unlikely that your pain is due to an issue in your heart.  Your cardiac enzyme (troponin) was normal today. Your EKG which is a measure of the heart's electrical activity is normal today. These would both show abnormalities if you were having a heart attack.  Your chest x-ray is normal today.  We also checked your electrolytes, liver, kidney function, and blood counts.  These are all normal.  Your thyroid test (TSH) was within the normal limits.  Please follow up with your PCP within the next week regarding your symptoms for further management.   Return to the ER if you have any shortness of breath, difficulty breathing, worsening chest pain, dizziness, jaw pain, left arm or shoulder pain, abdominal pain, fever, any other new or concerning symptoms

## 2023-05-30 NOTE — ED Triage Notes (Signed)
Pt here with c/o Shortness of breath, and feeling full. Pt also having heart palpitations. This is going on for 4 or 5 days. Pt coming in today bc symptoms have not gone away this time. Denies fever and chills. Pt having pain between shoulders and back. Denies cardiac history

## 2023-05-30 NOTE — ED Provider Notes (Signed)
[place] EMERGENCY DEPARTMENT AT [facility] Provider Note   CSN: [id number] Arrival date & time: 05/30/23  1221     History  Chief Complaint  Patient presents with   Shortness of Breath    Kelly Kim is a 37 y.o. female with history of anxiety, hypothyroidism, presents with concern for feelings of shortness of breath and feeling "full" for the past 4-5 days.  States this initially occurred after she ate a large meal, but the feeling resolved after she had a bowel movement.  The feeling returned today, but has not resolved after having a bowel movement.  She also notes a pain underneath her rib cage which gets better when she drinks cold water.  Reports pain between her shoulder blades.  She denies any substernal chest pain, pain with exertion, pleuritic pain.  Denies any history of heart or lung issues.   Shortness of Breath      Home Medications Prior to Admission medications   Medication Sig Start Date End Date Taking? Authorizing Provider  clonazePAM (KLONOPIN) 0.5 MG tablet Take 0.5 mg by mouth daily as needed for anxiety.    [provider]  fluticasone (FLONASE) 50 MCG/ACT nasal spray Place 1 spray into both nostrils daily for 14 days. 02/13/20 02/27/20  Avegno, Zachery Dakins, FNP  gabapentin (NEURONTIN) 100 MG capsule Take 1 capsule (100 mg total) by mouth 3 (three) times daily as needed. 05/17/23   Levert Feinstein, MD  levothyroxine (SYNTHROID) 75 MCG tablet Take 75 mcg by mouth every morning. 05/07/23   [provider]  SUMAtriptan (IMITREX) 50 MG tablet Take 1 tablet (50 mg total) by mouth every 2 (two) hours as needed for migraine. May repeat in 2 hours if headache persists or recurs. 05/17/23   Levert Feinstein, MD  ZEPBOUND 5 MG/0.5ML Pen SMARTSIG:5 Milligram(s) SUB-Q Once a Week 04/30/23   [provider]      Allergies    Patient has no known allergies.    Review of Systems   Review of Systems  Respiratory:  Positive for shortness of  breath.     Physical Exam Updated Vital Signs BP (!) 122/108 (BP Location: Right Arm)   Pulse 85   Temp 98.2 F (36.8 C) (Oral)   Resp (!) 22   Ht 5\' 10"  (1.778 m)   Wt 125.6 kg   LMP 05/17/2023   SpO2 100%   BMI 39.75 kg/m  Physical Exam Vitals and nursing note reviewed.  Constitutional:      General: She is not in acute distress.    Appearance: She is well-developed.  HENT:     Head: Normocephalic and atraumatic.  Eyes:     Conjunctiva/sclera: Conjunctivae normal.  Cardiovascular:     Rate and Rhythm: Normal rate and regular rhythm.     Heart sounds: No murmur heard. Pulmonary:     Effort: Pulmonary effort is normal. No respiratory distress.     Breath sounds: Normal breath sounds.  Abdominal:     Palpations: Abdomen is soft.     Tenderness: There is no abdominal tenderness.  Musculoskeletal:        General: No swelling.     Cervical back: Neck supple.  Skin:    General: Skin is warm and dry.     Capillary Refill: Capillary refill takes less than 2 seconds.  Neurological:     Mental Status: She is alert.  Psychiatric:        Mood and Affect: Mood normal.  ED Results / Procedures / Treatments   Labs (all labs ordered are listed, but only abnormal results are displayed) Labs Reviewed  RESP PANEL BY RT-PCR (RSV, FLU A&B, COVID)  [id number]  COMPREHENSIVE METABOLIC PANEL  CBC  TSH  POC URINE PREG, ED  TROPONIN I (HIGH SENSITIVITY)    EKG None  Radiology DG Chest Port 1 View Result Date: 05/30/2023 CLINICAL DATA:  Shortness of breath.  Palpitations. EXAM: PORTABLE CHEST 1 VIEW COMPARISON:  None Available. FINDINGS: Bilateral lung fields are clear. Bilateral costophrenic angles are clear. Normal cardio-mediastinal silhouette. No acute osseous abnormalities. The soft tissues are within normal limits. IMPRESSION: No active disease. Electronically Signed   By: Jules Schick M.D.   On: 05/30/2023 12:51    Procedures Procedures    Medications Ordered in  ED Medications  alum & mag hydroxide-simeth (MAALOX/MYLANTA) 200-200-20 MG/5ML suspension 30 mL (30 mLs Oral Given 05/30/23 1511)    And  lidocaine (XYLOCAINE) 2 % viscous mouth solution 15 mL (15 mLs Oral Given 05/30/23 1511)    ED Course/ Medical Decision Making/ A&P                                 Medical Decision Making Amount and/or Complexity of Data Reviewed Labs: ordered. Radiology: ordered.  Risk OTC drugs. Prescription drug management.     Differential diagnosis includes but is not limited to arrhythmia, ACS, pneumonia, COVID, flu, RSV, viral URI, constipation, peptic ulcer, hypothyroidism, hyperthyroidism   ED Course:  Patient well-appearing, although slightly anxious. Stable vital signs.  She is talking in full sentences on room air.  Her EKG is in normal sinus rhythm, troponin of 2, symptoms have been ongoing for the past couple days, low concern for ACS at this time.  Her chest x-ray is normal.  CBC and CMP unremarkable.  COVID, flu, RSV negative.  TSH within normal limits. Low concern for any acute etiology. Patient stable and appropriate for discharge at this time. Patient given Maalox for possible GI pain/etiology I suspect patient may have ulcer or acid reflux contributing to her symptoms. We discussed following up with primary and GI   Impression: Epigastric pain  Disposition:  The patient was discharged home with instructions to follow-up with PCP within the next week for further management. Continue on home omeprazole as prescribed by her PCP. GI contact info provided for patient, she was instructed to call and schedule and appointment Return precautions given.  Imaging Studies ordered: I ordered imaging studies including chest x-ray  I independently visualized the imaging with scope of interpretation limited to determining acute life threatening conditions related to emergency care. Imaging showed no acute abnormalities I agree with the radiologist  interpretation   Cardiac Monitoring: / EKG: The patient was maintained on a cardiac monitor.  I personally viewed and interpreted the cardiac monitored which showed an underlying rhythm of: Normal sinus rhythm              Final Clinical Impression(s) / ED Diagnoses Final diagnoses:  Epigastric pain    Rx / DC Orders ED Discharge Orders     None         Arabella Merles, PA-C 05/30/23 1514    Bethann Berkshire, MD 06/01/23 1004

## 2023-06-05 ENCOUNTER — Telehealth: Payer: Self-pay | Admitting: *Deleted

## 2023-06-05 ENCOUNTER — Ambulatory Visit: Payer: Medicaid Other | Admitting: Gastroenterology

## 2023-06-05 ENCOUNTER — Encounter: Payer: Self-pay | Admitting: Gastroenterology

## 2023-06-05 ENCOUNTER — Other Ambulatory Visit: Payer: Self-pay | Admitting: *Deleted

## 2023-06-05 ENCOUNTER — Encounter: Payer: Self-pay | Admitting: *Deleted

## 2023-06-05 VITALS — BP 120/84 | HR 89 | Temp 98.6°F | Ht 70.0 in | Wt 272.6 lb

## 2023-06-05 DIAGNOSIS — R1012 Left upper quadrant pain: Secondary | ICD-10-CM | POA: Insufficient documentation

## 2023-06-05 DIAGNOSIS — R1013 Epigastric pain: Secondary | ICD-10-CM

## 2023-06-05 DIAGNOSIS — R6881 Early satiety: Secondary | ICD-10-CM

## 2023-06-05 HISTORY — DX: Early satiety: R68.81

## 2023-06-05 HISTORY — DX: Left upper quadrant pain: R10.12

## 2023-06-05 NOTE — Telephone Encounter (Signed)
Carelon PA:  Order ID: [id number]       Authorized  Approval Valid Through: 06/05/2023 - 08/03/2023

## 2023-06-05 NOTE — Telephone Encounter (Signed)
Spoke with pt and CT was scheduled on same day of EGD.   CT rescheduled for 06/12/22, arrive at 9:45 am to check in  at [facility]. Pt informed.

## 2023-06-05 NOTE — Patient Instructions (Signed)
 Please have blood work done.  We are arranging a CT scan in the near future.  We are also arranging an upper endoscopy with Dr. Cindie!  We will see you in 3 months  Happy New Year!  It was a pleasure to see you today. I want to create trusting relationships with patients and provide genuine, compassionate, and quality care. I truly value your feedback, so please be on the lookout for a survey regarding your visit with me today. I appreciate your time in completing this!         Therisa MICAEL Stager, PhD, ANP-BC [facility] Gastroenterology
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
## 2023-06-05 NOTE — Telephone Encounter (Signed)
LMOVM to return call   CT scheduled at [facility], Tuesday 06/12/23, arrive at 4:15 pm to check in.
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
## 2023-06-12 ENCOUNTER — Encounter (HOSPITAL_COMMUNITY)
Admission: RE | Disposition: A | Discharge status: Home / Self Care | Payer: Self-pay | Source: Home / Self Care | Attending: Internal Medicine

## 2023-06-12 ENCOUNTER — Telehealth: Payer: Self-pay | Admitting: *Deleted

## 2023-06-12 ENCOUNTER — Ambulatory Visit (HOSPITAL_BASED_OUTPATIENT_CLINIC_OR_DEPARTMENT_OTHER): Payer: Medicaid Other | Admitting: Anesthesiology

## 2023-06-12 ENCOUNTER — Ambulatory Visit (HOSPITAL_COMMUNITY): Payer: Medicaid Other | Admitting: Anesthesiology

## 2023-06-12 ENCOUNTER — Encounter (HOSPITAL_COMMUNITY): Payer: Self-pay | Admitting: Internal Medicine

## 2023-06-12 ENCOUNTER — Ambulatory Visit (HOSPITAL_COMMUNITY): Payer: Medicaid Other

## 2023-06-12 ENCOUNTER — Ambulatory Visit (HOSPITAL_COMMUNITY)
Admission: RE | Admit: 2023-06-12 | Discharge: 2023-06-12 | Disposition: A | Discharge status: Home / Self Care | Payer: Medicaid Other | Attending: Internal Medicine | Admitting: Internal Medicine

## 2023-06-12 ENCOUNTER — Other Ambulatory Visit: Payer: Self-pay

## 2023-06-12 DIAGNOSIS — R1013 Epigastric pain: Secondary | ICD-10-CM | POA: Insufficient documentation

## 2023-06-12 DIAGNOSIS — K2970 Gastritis, unspecified, without bleeding: Secondary | ICD-10-CM | POA: Diagnosis not present

## 2023-06-12 DIAGNOSIS — R1012 Left upper quadrant pain: Secondary | ICD-10-CM | POA: Diagnosis not present

## 2023-06-12 DIAGNOSIS — F17210 Nicotine dependence, cigarettes, uncomplicated: Secondary | ICD-10-CM | POA: Insufficient documentation

## 2023-06-12 DIAGNOSIS — K3189 Other diseases of stomach and duodenum: Secondary | ICD-10-CM | POA: Diagnosis not present

## 2023-06-12 DIAGNOSIS — R6881 Early satiety: Secondary | ICD-10-CM | POA: Diagnosis not present

## 2023-06-12 DIAGNOSIS — E039 Hypothyroidism, unspecified: Secondary | ICD-10-CM | POA: Diagnosis not present

## 2023-06-12 HISTORY — PX: ESOPHAGOGASTRODUODENOSCOPY (EGD) WITH PROPOFOL: SHX5813

## 2023-06-12 HISTORY — PX: BIOPSY: SHX5522

## 2023-06-12 LAB — POCT PREGNANCY, URINE: Preg Test, Ur: NEGATIVE

## 2023-06-12 SURGERY — ESOPHAGOGASTRODUODENOSCOPY (EGD) WITH PROPOFOL
Anesthesia: General

## 2023-06-12 MED ORDER — LACTATED RINGERS IV SOLN: INTRAVENOUS | Status: DC

## 2023-06-12 MED ORDER — PROPOFOL 500 MG/50ML IV EMUL
INTRAVENOUS | Status: DC | PRN
Start: 2023-06-12 — End: 2023-06-12
  Administered 2023-06-12: 150 ug/kg/min via INTRAVENOUS

## 2023-06-12 MED ORDER — LIDOCAINE HCL (PF) 2 % IJ SOLN
INTRAMUSCULAR | Status: DC | PRN
  Administered 2023-06-12: 100 mg via INTRADERMAL

## 2023-06-12 MED ORDER — PROPOFOL 10 MG/ML IV BOLUS
INTRAVENOUS | Status: DC | PRN
  Administered 2023-06-12 (×2): 50 mg via INTRAVENOUS
  Administered 2023-06-12: 100 mg via INTRAVENOUS

## 2023-06-12 NOTE — Interval H&P Note (Signed)
 History and Physical Interval Note:  06/12/2023 12:40 PM  Kelly Kim  has presented today for surgery, with the diagnosis of LUQ abdominal pain, Early satiety.  The various methods of treatment have been discussed with the patient and family. After consideration of risks, benefits and other options for treatment, the patient has consented to  Procedure(s) with comments: ESOPHAGOGASTRODUODENOSCOPY (EGD) WITH PROPOFOL  (N/A) - 1:30 pm, asa 2 as a surgical intervention.  The patient's history has been reviewed, patient examined, no change in status, stable for surgery.  I have reviewed the patient's chart and labs.  Questions were answered to the patient's satisfaction.     [name]

## 2023-06-12 NOTE — Discharge Instructions (Addendum)
 EGD Discharge instructions Please read the instructions outlined below and refer to this sheet in the next few weeks. These discharge instructions provide you with general information on caring for yourself after you leave the hospital. Your doctor may also give you specific instructions. While your treatment has been planned according to the most current medical practices available, unavoidable complications occasionally occur. If you have any problems or questions after discharge, please call your doctor. ACTIVITY You may resume your regular activity but move at a slower pace for the next 24 hours.  Take frequent rest periods for the next 24 hours.  Walking will help expel (get rid of) the air and reduce the bloated feeling in your abdomen.  No driving for 24 hours (because of the anesthesia (medicine) used during the test).  You may shower.  Do not sign any important legal documents or operate any machinery for 24 hours (because of the anesthesia used during the test).  NUTRITION Drink plenty of fluids.  You may resume your normal diet.  Begin with a light meal and progress to your normal diet.  Avoid alcoholic beverages for 24 hours or as instructed by your caregiver.  MEDICATIONS You may resume your normal medications unless your caregiver tells you otherwise.  WHAT YOU CAN EXPECT TODAY You may experience abdominal discomfort such as a feeling of fullness or "gas" pains.  FOLLOW-UP Your doctor will discuss the results of your test with you.  SEEK IMMEDIATE MEDICAL ATTENTION IF ANY OF THE FOLLOWING OCCUR: Excessive nausea (feeling sick to your stomach) and/or vomiting.  Severe abdominal pain and distention (swelling).  Trouble swallowing.  Temperature over 101 F (37.8 C).  Rectal bleeding or vomiting of blood.   Your EGD revealed mild amount inflammation in your stomach.  I took biopsies of this to rule out infection with a bacteria called H. pylori.  I also took samples of your  small bowel to check for celiac disease.  Esophagus appeared normal.  Await pathology results, my office will contact you.  Continue current medications.  Agree with CT scan to further evaluate your symptoms.  Follow-up in GI office in 6-8 weeks OFFICE WITH CALL WITH APPOINTMENT  I hope you have a great rest of your week!  Carlin POUR. Cindie, D.O. Gastroenterology and Hepatology [facility]

## 2023-06-12 NOTE — Telephone Encounter (Signed)
 Patient called in and was not able to have urine preg test as advised. Spoke with melanie and patient will need to arrive 30 min prior to arrival time to have done. Called pt back and made aware.

## 2023-06-12 NOTE — Transfer of Care (Signed)
 Immediate Anesthesia Transfer of Care Note  Patient: Kelly Kim  Procedure(s) Performed: ESOPHAGOGASTRODUODENOSCOPY (EGD) WITH PROPOFOL  BIOPSY  Patient Location: Endoscopy Unit  Anesthesia Type:General  Level of Consciousness: awake  Airway & Oxygen Therapy: Patient Spontanous Breathing  Post-op Assessment: Report given to RN and Post -op Vital signs reviewed and stable  Post vital signs: Reviewed and stable  Last Vitals:  Vitals Value Taken Time  BP    Temp    Pulse    Resp    SpO2      Last Pain:  Vitals:   06/12/23 1324  TempSrc: Oral  PainSc:       Patients Stated Pain Goal: 8 (06/12/23 1219)  Complications: No notable events documented.

## 2023-06-12 NOTE — Anesthesia Procedure Notes (Signed)
 Date/Time: 06/12/2023 1:10 PM  Performed by: Eliodoro Deward FALCON, CRNAPre-anesthesia Checklist: Emergency Drugs available, Patient identified, Suction available and Patient being monitored Patient Re-evaluated:Patient Re-evaluated prior to induction Oxygen Delivery Method: Nasal cannula Induction Type: IV induction Placement Confirmation: positive ETCO2

## 2023-06-12 NOTE — Op Note (Signed)
 [facility] Patient Name: Kelly Kim Procedure Date: 06/12/2023 1:02 PM MRN: [id number] Date of Birth: 1986-02-09 Attending MD: Carlin POUR. Cindie , [place], [id number] CSN: [id number] Age: 38 Admit Type: Outpatient Procedure:                Upper GI endoscopy Indications:              Epigastric abdominal pain Providers:                Carlin POUR. Cindie, DO, Crystal Page, Bascom Blush Referring MD:              Medicines:                See the Anesthesia note for documentation of the                            administered medications Complications:            No immediate complications. Estimated Blood Loss:     Estimated blood loss was minimal. Procedure:                Pre-Anesthesia Assessment:                           - The anesthesia plan was to use monitored                            anesthesia care (MAC).                           After obtaining informed consent, the endoscope was                            passed under direct vision. Throughout the                            procedure, the patient's blood pressure, pulse, and                            oxygen saturations were monitored continuously. The                            [id number]-H190 ([id number]) scope was introduced through the                            mouth, and advanced to the second part of duodenum.                            The upper GI endoscopy was accomplished without                            difficulty. The patient tolerated the procedure                            well. Scope In: 1:15:55 PM Scope Out: 1:20:14 PM Total Procedure Duration: 0 hours 4 minutes 19 seconds  Findings:      The examined esophagus was normal.  Localized minimal inflammation characterized by erythema was found in       the gastric antrum. Biopsies were taken with a cold forceps for       Helicobacter pylori testing.      The duodenal bulb, first portion of the duodenum and second portion of       the duodenum were  normal. Biopsies for histology were taken with a cold       forceps for evaluation of celiac disease. Impression:               - Normal esophagus.                           - Gastritis. Biopsied.                           - Normal duodenal bulb, first portion of the                            duodenum and second portion of the duodenum.                            Biopsied. Moderate Sedation:      Per Anesthesia Care Recommendation:           - Patient has a contact number available for                            emergencies. The signs and symptoms of potential                            delayed complications were discussed with the                            patient. Return to normal activities tomorrow.                            Written discharge instructions were provided to the                            patient.                           - Resume previous diet.                           - Continue present medications.                           - Await pathology results.                           - Return to GI clinic in 6-8 weeks.                           - Agree with CT abd/pelvis to further evaluate                            (  scheduled for tomorrow) Procedure Code(s):        --- Professional ---                           [phone], Esophagogastroduodenoscopy, flexible,                            transoral; with biopsy, single or multiple Diagnosis Code(s):        --- Professional ---                           K29.70, Gastritis, unspecified, without bleeding                           R10.13, Epigastric pain CPT copyright 2022 American Medical Association. All rights reserved. The codes documented in this report are preliminary and upon coder review may  be revised to meet current compliance requirements. Carlin POUR. Cindie, DO Carlin POUR. Cindie, DO 06/12/2023 1:24:59 PM This report has been signed electronically. Number of Addenda: 0

## 2023-06-12 NOTE — Anesthesia Preprocedure Evaluation (Signed)
 Anesthesia Evaluation  Patient identified by MRN, date of birth, ID band Patient awake    Reviewed: Allergy & Precautions, H&P , NPO status , Patient's Chart, lab work & pertinent test results, reviewed documented beta blocker date and time   Airway Mallampati: II  TM Distance: >3 FB Neck ROM: full    Dental no notable dental hx.    Pulmonary neg pulmonary ROS, Current Smoker   Pulmonary exam normal breath sounds clear to auscultation       Cardiovascular Exercise Tolerance: Good hypertension, negative cardio ROS  Rhythm:regular Rate:Normal     Neuro/Psych  Headaches  Anxiety      negative psych ROS   GI/Hepatic negative GI ROS, Neg liver ROS,,,  Endo/Other  Hypothyroidism    Renal/GU negative Renal ROS  negative genitourinary   Musculoskeletal   Abdominal   Peds  Hematology negative hematology ROS (+)   Anesthesia Other Findings   Reproductive/Obstetrics negative OB ROS                             Anesthesia Physical Anesthesia Plan  ASA: 2  Anesthesia Plan: General   Post-op Pain Management:    Induction:   PONV Risk Score and Plan: Propofol  infusion  Airway Management Planned:   Additional Equipment:   Intra-op Plan:   Post-operative Plan:   Informed Consent: I have reviewed the patients History and Physical, chart, labs and discussed the procedure including the risks, benefits and alternatives for the proposed anesthesia with the patient or authorized representative who has indicated his/her understanding and acceptance.     Dental Advisory Given  Plan Discussed with: CRNA  Anesthesia Plan Comments:        Anesthesia Quick Evaluation

## 2023-06-13 ENCOUNTER — Ambulatory Visit (HOSPITAL_COMMUNITY)
Admission: RE | Admit: 2023-06-13 | Discharge: 2023-06-13 | Disposition: A | Discharge status: Home / Self Care | Payer: Medicaid Other | Source: Ambulatory Visit | Attending: Gastroenterology | Admitting: Gastroenterology

## 2023-06-13 DIAGNOSIS — R1012 Left upper quadrant pain: Secondary | ICD-10-CM | POA: Insufficient documentation

## 2023-06-13 DIAGNOSIS — R6881 Early satiety: Secondary | ICD-10-CM | POA: Insufficient documentation

## 2023-06-13 DIAGNOSIS — R932 Abnormal findings on diagnostic imaging of liver and biliary tract: Secondary | ICD-10-CM | POA: Diagnosis not present

## 2023-06-13 DIAGNOSIS — R1013 Epigastric pain: Secondary | ICD-10-CM | POA: Diagnosis not present

## 2023-06-13 DIAGNOSIS — K769 Liver disease, unspecified: Secondary | ICD-10-CM | POA: Diagnosis not present

## 2023-06-13 LAB — SURGICAL PATHOLOGY

## 2023-06-13 MED ORDER — IOHEXOL 300 MG/ML  SOLN
100.0000 mL | Freq: Once | INTRAMUSCULAR | Status: AC | PRN
  Administered 2023-06-13: 100 mL via INTRAVENOUS

## 2023-06-14 ENCOUNTER — Telehealth: Payer: Self-pay | Admitting: Neurology

## 2023-06-14 MED ORDER — TIZANIDINE HCL 4 MG PO TABS: 4.0000 mg | ORAL_TABLET | Freq: Four times a day (QID) | ORAL | 3 refills | Status: DC | PRN

## 2023-06-14 MED ORDER — ONDANSETRON HCL 4 MG PO TABS: 4.0000 mg | ORAL_TABLET | Freq: Three times a day (TID) | ORAL | 3 refills | Status: DC | PRN

## 2023-06-14 NOTE — Telephone Encounter (Signed)
 Returned call to pt who stated that she is improving not as intense.  This episode specifically has been ongoing 3-4 days and denied previous episodes. Imitrex , gabapentin  taken at 9am and by 12noon the pain returned. Still takes ibuprofen  400mg  prn uses as a last resort but doesn't help. Pt stated that the pt is like a burning sensation to scalp, neck pain on right side that is localized, top of ear hurts wear her glasses would lay which is why she is wearing contacts now due to irritation. I told pt that I would relay this information to provider and call her back with the recommendations. Pt voiced gratitude and understanding.

## 2023-06-14 NOTE — Telephone Encounter (Signed)
 Call to patient, no answer. Left message to return call

## 2023-06-14 NOTE — Anesthesia Postprocedure Evaluation (Signed)
 Anesthesia Post Note  Patient: Kelly Kim  Procedure(s) Performed: ESOPHAGOGASTRODUODENOSCOPY (EGD) WITH PROPOFOL  BIOPSY  Patient location during evaluation: Phase II Anesthesia Type: General Level of consciousness: awake Pain management: pain level controlled Vital Signs Assessment: post-procedure vital signs reviewed and stable Respiratory status: spontaneous breathing and respiratory function stable Cardiovascular status: blood pressure returned to baseline and stable Postop Assessment: no headache and no apparent nausea or vomiting Anesthetic complications: no Comments: Late entry   No notable events documented.   Last Vitals:  Vitals:   06/12/23 1219 06/12/23 1324  BP: 115/71 104/73  Pulse: 86 91  Resp: 13 15  Temp: 36.6 C 36.7 C  SpO2: 96% 99%    Last Pain:  Vitals:   06/12/23 1324  TempSrc: Oral  PainSc: 0-No pain                 [name]

## 2023-06-14 NOTE — Telephone Encounter (Signed)
 Call to check on her migraine and get a detailed list of medication she has tried, if needed, may come in for nerve block

## 2023-06-14 NOTE — Addendum Note (Signed)
 Addended by: Levert Feinstein on: 06/14/2023 04:33 PM   Modules accepted: Orders

## 2023-06-14 NOTE — Telephone Encounter (Signed)
 I received an after-hours call message this morning regarding a migraine.  The message stated she is having bad migraines and she cannot even wear her glasses and her scalp is burning.  I tried calling her on the number given, I got her voicemail which stated her full name.  I left a message encouraging her to call back or send a MyChart message.

## 2023-06-14 NOTE — Telephone Encounter (Signed)
 Meds ordered this encounter  Medications   tiZANidine  (ZANAFLEX ) 4 MG tablet    Sig: Take 1 tablet (4 mg total) by mouth every 6 (six) hours as needed for muscle spasms.    Dispense:  30 tablet    Refill:  3   ondansetron  (ZOFRAN ) 4 MG tablet    Sig: Take 1 tablet (4 mg total) by mouth every 8 (eight) hours as needed for nausea or vomiting.    Dispense:  20 tablet    Refill:  3     Please let her know, I have called in a new prescription,  Tonight, she may take another dose of Imitrex  with Aleve, tizanidine  as muscle relaxant, Zofran  for nausea, if needed add on Benadryl  for better sleep, she may have excessive drowsiness, need longer hours of sleep after combination therapy,  Hope above cocktail may abort her severe prolonged migraine, call back office if she still need help

## 2023-06-15 ENCOUNTER — Encounter (HOSPITAL_COMMUNITY): Payer: Self-pay | Admitting: Internal Medicine

## 2023-06-18 ENCOUNTER — Other Ambulatory Visit (HOSPITAL_COMMUNITY): Payer: Self-pay

## 2023-06-18 MED ORDER — ZEPBOUND 2.5 MG/0.5ML ~~LOC~~ SOAJ
0.5000 mg | SUBCUTANEOUS | 6 refills | Status: DC
  Filled 2023-06-18: qty 2, 28d supply, fill #0
  Filled 2023-07-13: qty 2, 28d supply, fill #1

## 2023-06-19 ENCOUNTER — Other Ambulatory Visit: Payer: Self-pay | Admitting: *Deleted

## 2023-06-19 DIAGNOSIS — K769 Liver disease, unspecified: Secondary | ICD-10-CM

## 2023-06-20 ENCOUNTER — Other Ambulatory Visit (HOSPITAL_COMMUNITY): Payer: Self-pay

## 2023-06-21 ENCOUNTER — Ambulatory Visit (HOSPITAL_COMMUNITY)
Admission: RE | Admit: 2023-06-21 | Discharge: 2023-06-21 | Disposition: A | Discharge status: Home / Self Care | Payer: Medicaid Other | Source: Ambulatory Visit | Attending: Internal Medicine | Admitting: Internal Medicine

## 2023-06-21 DIAGNOSIS — Z9049 Acquired absence of other specified parts of digestive tract: Secondary | ICD-10-CM | POA: Diagnosis not present

## 2023-06-21 DIAGNOSIS — K769 Liver disease, unspecified: Secondary | ICD-10-CM | POA: Insufficient documentation

## 2023-06-21 DIAGNOSIS — K760 Fatty (change of) liver, not elsewhere classified: Secondary | ICD-10-CM | POA: Diagnosis not present

## 2023-06-21 MED ORDER — IOHEXOL 300 MG/ML  SOLN
100.0000 mL | Freq: Once | INTRAMUSCULAR | Status: AC | PRN
  Administered 2023-06-21: 100 mL via INTRAVENOUS

## 2023-06-21 MED ORDER — SODIUM CHLORIDE (PF) 0.9 % IJ SOLN
INTRAMUSCULAR | Status: AC
Start: 2023-06-21 — End: ?
  Filled 2023-06-21: qty 50

## 2023-06-22 ENCOUNTER — Ambulatory Visit (HOSPITAL_COMMUNITY): Payer: Medicaid Other

## 2023-06-22 DIAGNOSIS — F419 Anxiety disorder, unspecified: Secondary | ICD-10-CM | POA: Diagnosis not present

## 2023-06-22 DIAGNOSIS — R109 Unspecified abdominal pain: Secondary | ICD-10-CM | POA: Diagnosis not present

## 2023-06-22 DIAGNOSIS — G43909 Migraine, unspecified, not intractable, without status migrainosus: Secondary | ICD-10-CM | POA: Diagnosis not present

## 2023-06-22 DIAGNOSIS — N943 Premenstrual tension syndrome: Secondary | ICD-10-CM | POA: Diagnosis not present

## 2023-06-22 DIAGNOSIS — E669 Obesity, unspecified: Secondary | ICD-10-CM | POA: Diagnosis not present

## 2023-06-22 DIAGNOSIS — F17219 Nicotine dependence, cigarettes, with unspecified nicotine-induced disorders: Secondary | ICD-10-CM | POA: Diagnosis not present

## 2023-06-22 DIAGNOSIS — R002 Palpitations: Secondary | ICD-10-CM | POA: Diagnosis not present

## 2023-06-22 DIAGNOSIS — E039 Hypothyroidism, unspecified: Secondary | ICD-10-CM | POA: Diagnosis not present

## 2023-06-22 DIAGNOSIS — R7301 Impaired fasting glucose: Secondary | ICD-10-CM | POA: Diagnosis not present

## 2023-06-26 ENCOUNTER — Telehealth: Payer: Self-pay | Admitting: Internal Medicine

## 2023-06-26 NOTE — Telephone Encounter (Signed)
Pt called asking if her CT results were available yet. Please call her at [phone]

## 2023-06-27 NOTE — Telephone Encounter (Signed)
[name] has not read your results yet due to their load because we do not have any readers at [facility]. I will hopefully have your report by end of tomorrow. Then once it is read the Dr gets it next. Be assured I will give you a call regarding your results.     Thank you, Heloise Beecham, CMA

## 2023-06-27 NOTE — Telephone Encounter (Signed)
Spoke with [facility] and advised of pt's CT abd/pelvis needing to be read. She advised she will put it up with a ticket to have read.

## 2023-06-27 NOTE — Telephone Encounter (Signed)
Pt phoned back and her results are in. I looked and they are back. Pt wants an appt to see Lewie Loron to discuss he next step in her care and pt also advises that she cannot do an MRI because of steel in her leg.  I advised the pt Lewie Loron is off until next Tuesday and the pt agrees to be scheduled to see her then. Mandy please schedule the pt.

## 2023-07-02 ENCOUNTER — Encounter (INDEPENDENT_AMBULATORY_CARE_PROVIDER_SITE_OTHER): Payer: Self-pay

## 2023-07-02 ENCOUNTER — Ambulatory Visit: Payer: Medicaid Other | Admitting: Family

## 2023-07-03 ENCOUNTER — Ambulatory Visit: Payer: Medicaid Other | Admitting: Gastroenterology

## 2023-07-11 ENCOUNTER — Encounter: Payer: Self-pay | Admitting: Gastroenterology

## 2023-07-11 ENCOUNTER — Ambulatory Visit (INDEPENDENT_AMBULATORY_CARE_PROVIDER_SITE_OTHER): Payer: Medicaid Other | Admitting: Gastroenterology

## 2023-07-11 VITALS — BP 122/81 | HR 103 | Temp 97.8°F | Ht 70.0 in | Wt 266.3 lb

## 2023-07-11 DIAGNOSIS — K769 Liver disease, unspecified: Secondary | ICD-10-CM | POA: Diagnosis not present

## 2023-07-11 DIAGNOSIS — K760 Fatty (change of) liver, not elsewhere classified: Secondary | ICD-10-CM

## 2023-07-11 DIAGNOSIS — R1012 Left upper quadrant pain: Secondary | ICD-10-CM | POA: Diagnosis not present

## 2023-07-11 HISTORY — DX: Liver disease, unspecified: K76.9

## 2023-07-11 NOTE — Patient Instructions (Addendum)
 We will order labs today to further assess the liver and an alpha gal panel.    We will plan on repeating the CT liver with/without contrast in 6 months.  We will see you in 6 months! Continue omeprazole  as you are doing.   I enjoyed seeing you again today! I value our relationship and want to provide genuine, compassionate, and quality care. You may receive a survey regarding your visit with me, and I welcome your feedback! Thanks so much for taking the time to complete this. I look forward to seeing you again.      Therisa MICAEL Stager, PhD, ANP-BC [facility]
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
## 2023-07-14 LAB — CANCER ANTIGEN 19-9: CA 19-9: 8 U/mL (ref 0–35)

## 2023-07-14 LAB — NASH FIBROSURE(R) PLUS
ALPHA 2-MACROGLOBULINS, QN: 136 mg/dL (ref 110–276)
ALT (SGPT) P5P: 33 [IU]/L (ref 0–40)
AST (SGOT) P5P: 26 [IU]/L (ref 0–40)
Apolipoprotein A-1: 132 mg/dL (ref 116–209)
Bilirubin, Total: <0.1 mg/dL (ref 0.0–1.2)
Cholesterol, Total: 165 mg/dL (ref 100–199)
Fibrosis Score: 0.01 (ref 0.00–0.21)
GGT: 25 [IU]/L (ref 0–60)
Glucose: 98 mg/dL (ref 70–99)
Haptoglobin: 265 mg/dL (ref 33–278)
NASH Score: 0.18 (ref 0.00–0.25)
Steatosis Score: 0.52 — ABNORMAL HIGH (ref 0.00–0.40)
Triglycerides: 109 mg/dL (ref 0–149)

## 2023-07-14 LAB — ALPHA-GAL PANEL
Allergen Lamb IgE: 1.54 kU/L — AB
Beef IgE: 2.77 kU/L — AB
IgE (Immunoglobulin E), Serum: 103 [IU]/mL (ref 6–495)
O215-IgE Alpha-Gal: 9.92 kU/L — AB
Pork IgE: 0.39 kU/L — AB

## 2023-07-14 LAB — ENHANCED LIVER FIBROSIS (ELF): ELF(TM) Score: 8.53 (ref <9.80)

## 2023-07-14 LAB — AFP TUMOR MARKER: AFP, Serum, Tumor Marker: 2.5 ng/mL (ref 0.0–6.4)

## 2023-07-31 DIAGNOSIS — R899 Unspecified abnormal finding in specimens from other organs, systems and tissues: Secondary | ICD-10-CM

## 2023-08-02 ENCOUNTER — Ambulatory Visit: Payer: Medicaid Other | Admitting: Gastroenterology

## 2023-08-07 ENCOUNTER — Ambulatory Visit: Payer: Medicaid Other | Admitting: Gastroenterology

## 2023-08-16 ENCOUNTER — Telehealth: Payer: Self-pay | Admitting: Neurology

## 2023-08-16 ENCOUNTER — Telehealth: Payer: Self-pay

## 2023-08-16 ENCOUNTER — Encounter: Payer: Self-pay | Admitting: Neurology

## 2023-08-16 MED ORDER — GABAPENTIN 100 MG PO CAPS: 100.0000 mg | ORAL_CAPSULE | Freq: Three times a day (TID) | ORAL | 5 refills | Status: AC | PRN

## 2023-08-16 NOTE — Addendum Note (Signed)
 Addended by: Eather Colas E on: 08/16/2023 12:59 PM   Modules accepted: Orders

## 2023-08-16 NOTE — Telephone Encounter (Signed)
 Pt returned call. Please call back when available.

## 2023-08-16 NOTE — Telephone Encounter (Signed)
 Called and relayed dr. Teofilo Pod results:  I recommend that she take gabapentin 100 mg strength, 1 pill 3 times a day, she can take it consistently and daily but I highly recommend that she stop taking Advil daily. It may be perpetuating headaches at this point. I recommend that she stay well-hydrated and drink at least 6, if not 8 cups of water per day, 8 ounce size each. I recommend that she get 7 to 8 hours of sleep on any given night. I also recommend that she avoid caffeine or limit herself to up to 1 serving per day and avoid any caffeine about 6 hours before bedtime.   Pt voiced gratitude and understanding and stated the gabapentin was sent to the wrong pharmacy so I sent to [facility] [address]. Pt voiced gratitude and understanding.

## 2023-08-16 NOTE — Telephone Encounter (Signed)
 I recommend that she take gabapentin 100 mg strength, 1 pill 3 times a day, she can take it consistently and daily but I highly recommend that she stop taking Advil daily.  It may be perpetuating headaches at this point.  I recommend that she stay well-hydrated and drink at least 6, if not 8 cups of water per day, 8 ounce size each.  I recommend that she get 7 to 8 hours of sleep on any given night.  I also recommend that she avoid caffeine or limit herself to up to 1 serving per day and avoid any caffeine about 6 hours before bedtime.

## 2023-08-16 NOTE — Telephone Encounter (Signed)
 Referral placed.

## 2023-08-16 NOTE — Telephone Encounter (Signed)
 Lvm by hf 08/16/23

## 2023-08-16 NOTE — Telephone Encounter (Signed)
 Call to patient, who reports since last call from Dr. Terrace Arabia she has only had 1-2 migraine free days.She reports doing to chiropractor who referred her for occipital neuralgia. Dr. Terrace Arabia diagnosed her with migraines. Patient repots pain in right shoulder, neck on the right side and comes up behind right ear. The pain is sharp and burning in those area and the pain in scalp is burning. She states if she presses on the area she gets relief. Laying down flat helps. She is taking 2-3 Advil daily, the Zanaflex, and 100 mg gabapentin. She is not getting any long term relief but does get short term relief for 2-3 hours. I advised to take another gabapentin since that provides relief. I also advised I would send to Dr. Frances Furbish as covering MD and reach out with any additional recommendations and also follow up with Dr. Terrace Arabia. Patient in agreement and is just concerned that it may be something other than migraines. Advised if she is not getting relief or other symptoms begin she can reach out to PCP, UC, or ER.  Patient can not do MRI due to previous surgeries and metals in her leg.

## 2023-08-16 NOTE — Telephone Encounter (Signed)
 error

## 2023-08-20 DIAGNOSIS — F411 Generalized anxiety disorder: Secondary | ICD-10-CM | POA: Diagnosis not present

## 2023-08-20 DIAGNOSIS — F32A Depression, unspecified: Secondary | ICD-10-CM | POA: Diagnosis not present

## 2023-08-20 DIAGNOSIS — F909 Attention-deficit hyperactivity disorder, unspecified type: Secondary | ICD-10-CM | POA: Diagnosis not present

## 2023-09-06 ENCOUNTER — Telehealth: Payer: Self-pay

## 2023-09-06 NOTE — Telephone Encounter (Signed)
 LVM for pt to bring her CPAP Machine with her to her appointment on Monday.

## 2023-09-07 ENCOUNTER — Ambulatory Visit
Admission: EM | Admit: 2023-09-07 | Discharge: 2023-09-07 | Disposition: A | Discharge status: Home / Self Care | Attending: Family Medicine | Admitting: Family Medicine

## 2023-09-07 DIAGNOSIS — J039 Acute tonsillitis, unspecified: Secondary | ICD-10-CM

## 2023-09-07 DIAGNOSIS — Z20818 Contact with and (suspected) exposure to other bacterial communicable diseases: Secondary | ICD-10-CM

## 2023-09-07 LAB — POCT RAPID STREP A (OFFICE): Rapid Strep A Screen: NEGATIVE

## 2023-09-07 MED ORDER — AMOXICILLIN 875 MG PO TABS: 875.0000 mg | ORAL_TABLET | Freq: Two times a day (BID) | ORAL | 0 refills | Status: DC

## 2023-09-07 MED ORDER — LIDOCAINE VISCOUS HCL 2 % MT SOLN: 10.0000 mL | OROMUCOSAL | 0 refills | Status: DC | PRN

## 2023-09-07 NOTE — ED Triage Notes (Signed)
 Sore throat, abdominal pain, headache x 1 day. Taking tylenol.   Pt states her daughter was positive for strep throat today.
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
## 2023-09-10 ENCOUNTER — Institutional Professional Consult (permissible substitution): Admitting: Neurology

## 2023-09-10 ENCOUNTER — Telehealth: Payer: Self-pay | Admitting: Neurology

## 2023-09-10 DIAGNOSIS — E039 Hypothyroidism, unspecified: Secondary | ICD-10-CM | POA: Insufficient documentation

## 2023-09-10 DIAGNOSIS — N920 Excessive and frequent menstruation with regular cycle: Secondary | ICD-10-CM | POA: Insufficient documentation

## 2023-09-10 DIAGNOSIS — K219 Gastro-esophageal reflux disease without esophagitis: Secondary | ICD-10-CM

## 2023-09-10 DIAGNOSIS — G43909 Migraine, unspecified, not intractable, without status migrainosus: Secondary | ICD-10-CM

## 2023-09-10 DIAGNOSIS — R7309 Other abnormal glucose: Secondary | ICD-10-CM | POA: Insufficient documentation

## 2023-09-10 DIAGNOSIS — O360990 Maternal care for other rhesus isoimmunization, unspecified trimester, not applicable or unspecified: Secondary | ICD-10-CM | POA: Insufficient documentation

## 2023-09-10 DIAGNOSIS — F419 Anxiety disorder, unspecified: Secondary | ICD-10-CM | POA: Insufficient documentation

## 2023-09-10 DIAGNOSIS — G4733 Obstructive sleep apnea (adult) (pediatric): Secondary | ICD-10-CM | POA: Insufficient documentation

## 2023-09-10 DIAGNOSIS — F17200 Nicotine dependence, unspecified, uncomplicated: Secondary | ICD-10-CM | POA: Insufficient documentation

## 2023-09-10 DIAGNOSIS — R809 Proteinuria, unspecified: Secondary | ICD-10-CM

## 2023-09-10 DIAGNOSIS — F3281 Premenstrual dysphoric disorder: Secondary | ICD-10-CM | POA: Insufficient documentation

## 2023-09-10 HISTORY — DX: Nicotine dependence, unspecified, uncomplicated: F17.200

## 2023-09-10 HISTORY — DX: Migraine, unspecified, not intractable, without status migrainosus: G43.909

## 2023-09-10 HISTORY — DX: Gastro-esophageal reflux disease without esophagitis: K21.9

## 2023-09-10 HISTORY — DX: Obstructive sleep apnea (adult) (pediatric): G47.33

## 2023-09-10 HISTORY — DX: Other abnormal glucose: R73.09

## 2023-09-10 HISTORY — DX: Maternal care for other rhesus isoimmunization, unspecified trimester, not applicable or unspecified: O36.0990

## 2023-09-10 HISTORY — DX: Proteinuria, unspecified: R80.9

## 2023-09-10 HISTORY — DX: Premenstrual dysphoric disorder: F32.81

## 2023-09-10 HISTORY — DX: Excessive and frequent menstruation with regular cycle: N92.0

## 2023-09-10 HISTORY — DX: Morbid (severe) obesity due to excess calories: E66.01

## 2023-09-10 NOTE — Telephone Encounter (Signed)
 NS for sleep consult.

## 2023-09-18 DIAGNOSIS — E039 Hypothyroidism, unspecified: Secondary | ICD-10-CM | POA: Diagnosis not present

## 2023-09-26 ENCOUNTER — Telehealth: Payer: Self-pay

## 2023-09-26 DIAGNOSIS — N943 Premenstrual tension syndrome: Secondary | ICD-10-CM | POA: Diagnosis not present

## 2023-09-26 DIAGNOSIS — R002 Palpitations: Secondary | ICD-10-CM | POA: Diagnosis not present

## 2023-09-26 DIAGNOSIS — F419 Anxiety disorder, unspecified: Secondary | ICD-10-CM | POA: Diagnosis not present

## 2023-09-26 DIAGNOSIS — R7309 Other abnormal glucose: Secondary | ICD-10-CM | POA: Insufficient documentation

## 2023-09-26 DIAGNOSIS — E039 Hypothyroidism, unspecified: Secondary | ICD-10-CM | POA: Diagnosis not present

## 2023-09-26 NOTE — Telephone Encounter (Signed)
 LVM for pt to bring her CPAP Machine with her to her appointment on tomorrow 09/27/2023

## 2023-09-26 NOTE — Telephone Encounter (Signed)
 Please see MyChart message from today 09/26/23

## 2023-09-27 ENCOUNTER — Encounter: Payer: Self-pay | Admitting: Neurology

## 2023-09-27 ENCOUNTER — Ambulatory Visit (INDEPENDENT_AMBULATORY_CARE_PROVIDER_SITE_OTHER): Admitting: Neurology

## 2023-09-27 VITALS — BP 136/99 | HR 74 | Ht 70.0 in | Wt 258.8 lb

## 2023-09-27 DIAGNOSIS — I493 Ventricular premature depolarization: Secondary | ICD-10-CM | POA: Diagnosis not present

## 2023-09-27 DIAGNOSIS — G4733 Obstructive sleep apnea (adult) (pediatric): Secondary | ICD-10-CM

## 2023-09-27 DIAGNOSIS — R002 Palpitations: Secondary | ICD-10-CM

## 2023-09-27 NOTE — Patient Instructions (Addendum)
 It was nice to meet you today.  You are fully compliant with your AutoPap machine, keep up the good work!  Please work on complete smoking cessation. If you continue to experience palpitations, talk to your PCP about potentially seeing a cardiologist.  Please continue using your autoPAP regularly. While your insurance requires that you use PAP at least 4 hours each night on 70% of the nights, I recommend, that you not skip any nights and use it throughout the night if you can. Getting used to PAP and staying with the treatment long term does take time and patience and discipline. Untreated obstructive sleep apnea when it is moderate to severe can have an adverse impact on cardiovascular health and raise her risk for heart disease, arrhythmias, hypertension, congestive heart failure, stroke and diabetes. Untreated obstructive sleep apnea causes sleep disruption, nonrestorative sleep, and sleep deprivation. This can have an impact on your day to day functioning and cause daytime sleepiness and impairment of cognitive function, memory loss, mood disturbance, and problems focussing. Using PAP regularly can improve these symptoms.  Please follow-up in sleep clinic in about a year, we can offer you a virtual visit if you like, you can see one of our nurse practitioners.

## 2023-09-27 NOTE — Addendum Note (Signed)
 Addended by: Beya Tipps on: 09/27/2023 01:55 PM   Modules accepted: Orders
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
## 2023-10-01 ENCOUNTER — Ambulatory Visit: Admitting: Internal Medicine

## 2023-10-01 ENCOUNTER — Encounter: Payer: Self-pay | Admitting: Internal Medicine

## 2023-10-01 VITALS — BP 96/70 | HR 101 | Temp 98.2°F | Resp 16 | Ht 68.7 in | Wt 258.5 lb

## 2023-10-01 DIAGNOSIS — Z91014 Allergy to mammalian meats: Secondary | ICD-10-CM

## 2023-10-01 MED ORDER — EPINEPHRINE 0.3 MG/0.3ML IJ SOAJ: 0.3000 mg | INTRAMUSCULAR | 0 refills | Status: DC | PRN

## 2023-10-01 NOTE — Patient Instructions (Signed)
 Alpha Gal Syndrome: - please strictly avoid all mammalian meat. Okay to eat chicken, [place], seafood.   - for SKIN only reaction, okay to take Benadryl  2 teaspoonful every 6 hours as needed - for SKIN + ANY additional symptoms, OR IF concern for LIFE THREATENING reaction = Epipen  Autoinjector EpiPen  0.3 mg. - If using Epinephrine  autoinjector, call 911 or go to the ER.

## 2023-10-01 NOTE — Progress Notes (Signed)
 12 pgs tp [facility] for cpap supplies.  Received confirmation by fax that received.
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
## 2023-10-04 ENCOUNTER — Telehealth: Payer: Self-pay

## 2023-10-04 NOTE — Telephone Encounter (Signed)
 [facility] called on the CPAP order for this patient - they need to know her last provider for her CPAP machine before they can take her on as a patient. Please give them a call back at [phone]

## 2023-10-04 NOTE — Telephone Encounter (Signed)
 I called [employer] and relayed info given to me by patient. The staff member I spoke with will provide this info to the RT. She believes they will be able to proceed. I called the patient and LVM (ok per DPR) letting her know the update.

## 2023-10-04 NOTE — Telephone Encounter (Signed)
 Called pt. She was treated at [facility] but doesn't recall doctor's name. She said she paid cash for her machine back in 2019. She did not have insurance then. I tried to call [facility]. I tried multiple extensions but could not reach anyone. Will let [facility] know what I have found out so far.

## 2023-10-16 ENCOUNTER — Other Ambulatory Visit: Payer: Self-pay

## 2023-10-16 DIAGNOSIS — E079 Disorder of thyroid, unspecified: Secondary | ICD-10-CM | POA: Insufficient documentation

## 2023-10-16 DIAGNOSIS — Z87440 Personal history of urinary (tract) infections: Secondary | ICD-10-CM | POA: Insufficient documentation

## 2023-10-16 DIAGNOSIS — N943 Premenstrual tension syndrome: Secondary | ICD-10-CM | POA: Insufficient documentation

## 2023-10-16 DIAGNOSIS — R0602 Shortness of breath: Secondary | ICD-10-CM | POA: Insufficient documentation

## 2023-10-16 DIAGNOSIS — R079 Chest pain, unspecified: Secondary | ICD-10-CM | POA: Insufficient documentation

## 2023-10-16 DIAGNOSIS — R002 Palpitations: Secondary | ICD-10-CM | POA: Insufficient documentation

## 2023-10-17 ENCOUNTER — Ambulatory Visit

## 2023-10-17 VITALS — BP 110/74 | HR 80 | Ht 70.0 in | Wt 254.2 lb

## 2023-10-17 DIAGNOSIS — R002 Palpitations: Secondary | ICD-10-CM

## 2023-10-17 DIAGNOSIS — F17200 Nicotine dependence, unspecified, uncomplicated: Secondary | ICD-10-CM | POA: Insufficient documentation
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
## 2023-10-17 NOTE — Assessment & Plan Note (Signed)
 Chronic palpitations. Described as skipped beat or extra beat. No significant abnormality on EKG today. Isolated PVC noted on prior EKG from PCPs office.  Description appears consistent with likely ectopic atrial beats, possibly PVCs.  Described about potential triggers for this including stress, lack of sleep, anxiety, dehydration, stimulants such as smoking, caffeinated drinks. Hormone imbalances and especially with weight loss significant over the past 6 months metabolic changes could contribute to this.  Advised to keep herself well-hydrated. Advised to avoid stimulants and quit smoking.  Will check Zio patch for 14 days to assess overall ectopy burden. Will obtain transthoracic echocardiogram to rule out any significant cardiac structure and functional abnormalities.  If significant ectopy burden will consider beta-blockers. She is agreeable with the plan.

## 2023-10-17 NOTE — Patient Instructions (Signed)
 Medication Instructions:  Your physician recommends that you continue on your current medications as directed. Please refer to the Current Medication list given to you today.  *If you need a refill on your cardiac medications before your next appointment, please call your pharmacy*   Lab Work: None Ordered If you have labs (blood work) drawn today and your tests are completely normal, you will receive your results only by: MyChart Message (if you have MyChart) OR A paper copy in the mail If you have any lab test that is abnormal or we need to change your treatment, we will call you to review the results.   Testing/Procedures: A zio monitor was ordered today. It will remain on for 14 days. Remove 10/31/2023. You will then return monitor and event diary in provided box. It takes 1-2 weeks for report to be downloaded and returned to us . We will call you with the results. If monitor falls off or has orange flashing light, please call Zio for further instructions.   Echocardiogram An echocardiogram is a test that uses sound waves (ultrasound) to produce images of the heart. Images from an echocardiogram can provide important information about: Heart size and shape. The size and thickness and movement of your heart's walls. Heart muscle function and strength. Heart valve function or if you have stenosis. Stenosis is when the heart valves are too narrow. If blood is flowing backward through the heart valves (regurgitation). A tumor or infectious growth around the heart valves. Areas of heart muscle that are not working well because of poor blood flow or injury from a heart attack. Aneurysm detection. An aneurysm is a weak or damaged part of an artery wall. The wall bulges out from the normal force of blood pumping through the body. Tell a health care provider about: Any allergies you have. All medicines you are taking, including vitamins, herbs, eye drops, creams, and over-the-counter  medicines. Any blood disorders you have. Any surgeries you have had. Any medical conditions you have. Whether you are pregnant or may be pregnant. What are the risks? Generally, this is a safe test. However, problems may occur, including an allergic reaction to dye (contrast) that may be used during the test. What happens before the test? No specific preparation is needed. You may eat and drink normally. What happens during the test?  You will take off your clothes from the waist up and put on a hospital gown. Electrodes or electrocardiogram (ECG)patches may be placed on your chest. The electrodes or patches are then connected to a device that monitors your heart rate and rhythm. You will lie down on a table for an ultrasound exam. A gel will be applied to your chest to help sound waves pass through your skin. A handheld device, called a transducer, will be pressed against your chest and moved over your heart. The transducer produces sound waves that travel to your heart and bounce back (or "echo" back) to the transducer. These sound waves will be captured in real-time and changed into images of your heart that can be viewed on a video monitor. The images will be recorded on a computer and reviewed by your health care provider. You may be asked to change positions or hold your breath for a short time. This makes it easier to get different views or better views of your heart. In some cases, you may receive contrast through an IV in one of your veins. This can improve the quality of the pictures from your heart.  The procedure may vary among health care providers and hospitals. What can I expect after the test? You may return to your normal, everyday life, including diet, activities, and medicines, unless your health care provider tells you not to do that. Follow these instructions at home: It is up to you to get the results of your test. Ask your health care provider, or the department that is  doing the test, when your results will be ready. Keep all follow-up visits. This is important. Summary An echocardiogram is a test that uses sound waves (ultrasound) to produce images of the heart. Images from an echocardiogram can provide important information about the size and shape of your heart, heart muscle function, heart valve function, and other possible heart problems. You do not need to do anything to prepare before this test. You may eat and drink normally. After the echocardiogram is completed, you may return to your normal, everyday life, unless your health care provider tells you not to do that. This information is not intended to replace advice given to you by your health care provider. Make sure you discuss any questions you have with your health care provider. Document Revised: 02/02/2021 Document Reviewed: 01/13/2020 Elsevier Patient Education  2023 Elsevier Inc.      Follow-Up: At [facility], you and your health needs are our priority.  As part of our continuing mission to provide you with exceptional heart care, we have created designated Provider Care Teams.  These Care Teams include your primary Cardiologist (physician) and Advanced Practice Providers (APPs -  Physician Assistants and Nurse Practitioners) who all work together to provide you with the care you need, when you need it.  We recommend signing up for the patient portal called "MyChart".  Sign up information is provided on this After Visit Summary.  MyChart is used to connect with patients for Virtual Visits (Telemedicine).  Patients are able to view lab/test results, encounter notes, upcoming appointments, etc.  Non-urgent messages can be sent to your provider as well.   To learn more about what you can do with MyChart, go to [email or web address].    Your next appointment:    Based on test results

## 2023-10-17 NOTE — Assessment & Plan Note (Signed)
 Advised about overall harmful effects of tobacco smoking. Potential role for triggering ectopic beats reviewed.  Advised to quit.

## 2023-11-14 ENCOUNTER — Ambulatory Visit (HOSPITAL_COMMUNITY): Attending: Cardiology

## 2023-11-27 ENCOUNTER — Emergency Department (HOSPITAL_COMMUNITY)
Admission: EM | Admit: 2023-11-27 | Discharge: 2023-11-27 | Disposition: A | Discharge status: Home / Self Care | Attending: Emergency Medicine | Admitting: Emergency Medicine

## 2023-11-27 ENCOUNTER — Other Ambulatory Visit: Payer: Self-pay

## 2023-11-27 DIAGNOSIS — T7840XA Allergy, unspecified, initial encounter: Secondary | ICD-10-CM | POA: Diagnosis not present

## 2023-11-27 MED ORDER — FAMOTIDINE 20 MG PO TABS
20.0000 mg | ORAL_TABLET | Freq: Once | ORAL | Status: AC
  Administered 2023-11-27: 20 mg via ORAL
  Filled 2023-11-27: qty 1

## 2023-11-27 MED ORDER — PREDNISONE 50 MG PO TABS
60.0000 mg | ORAL_TABLET | Freq: Once | ORAL | Status: AC
  Administered 2023-11-27: 60 mg via ORAL
  Filled 2023-11-27: qty 1

## 2023-11-27 MED ORDER — PREDNISONE 50 MG PO TABS: 50.0000 mg | ORAL_TABLET | Freq: Every day | ORAL | 0 refills | Status: DC

## 2023-11-27 NOTE — Discharge Instructions (Signed)
 Continue taking diphenhydramine  (Benadryl ) and famotidine  (Pepcid  AC) as needed for any breakthrough itching.  Return to the emergency department if symptoms are getting worse.

## 2023-11-27 NOTE — ED Triage Notes (Signed)
 Pt states she started itching all over around 2200 last night, took Benadryl  and went to bed. Reports waking up feeling worse, has hx of alpha gal but does not recall eating anything that would cause a reaction. Pt is anxious in triage.

## 2023-11-27 NOTE — ED Notes (Signed)
 Pt stated she has been resting and feels as though the medicine has helped her feel better.

## 2023-11-27 NOTE — ED Provider Notes (Signed)
 [facility] EMERGENCY DEPARTMENT AT [facility] Provider Note   CSN: [id number] Arrival date & time: 11/27/23  9850     Patient presents with: Allergic Reaction   Kelly Kim is a 38 y.o. female.   The history is provided by the patient.  Allergic Reaction She has history of GERD, migraines, alpha gal syndrome and developed itching in her scalp and in her axillae.  She did not break out in a rash that she was aware of.  She took diphenhydramine  which seemed to give some relief and she went to sleep but woke up with the itching having recurred.  She took additional diphenhydramine .  EMS came to evaluate her and she was doing well and decided not to come to the emergency department at that point.  However, itching recurred again and she decided to come in.  She denies any difficulty breathing or swallowing.  She denies any consumption of red meat or pork.   Prior to Admission medications   Medication Sig Start Date End Date Taking? Authorizing Provider  Bacillus Coagulans-Inulin (PROBIOTIC) 1-250 BILLION-MG CAPS Take 1 capsule by mouth daily.    [provider]  cetirizine (ZYRTEC ALLERGY) 10 MG tablet Take 10 mg by mouth daily.    [provider]  clonazePAM (KLONOPIN) 0.5 MG tablet Take 0.5 mg by mouth 2 (two) times daily as needed for anxiety.    [provider]  EPINEPHrine  (EPIPEN  2-PAK) 0.3 mg/0.3 mL IJ SOAJ injection Inject 0.3 mg into the muscle as needed for anaphylaxis. 10/01/23   Tobie Arleta SQUIBB, MD  FLUoxetine (PROZAC) 10 MG capsule Take 10 mg by mouth daily. 04/04/23   [provider]  gabapentin  (NEURONTIN ) 100 MG capsule Take 1 capsule (100 mg total) by mouth 3 (three) times daily as needed. 08/16/23   Athar, Saima, MD  levothyroxine  (SYNTHROID ) 75 MCG tablet TAKE 1 TABLET BY MOUTH EVERY MORNING ON AN EMPTY STOMACH for 90    [provider]  Multiple Vitamin (MULTIVITAMIN) tablet Take 1 tablet by mouth daily.    [provider]    Allergies: Patient has no known allergies.    Review of Systems  All other systems reviewed and are negative.   Updated Vital Signs BP 117/88   Pulse 94   Temp 98.4 F (36.9 C) (Oral)   Resp 20   LMP 11/15/2023 (Approximate)   SpO2 96%   Physical Exam Vitals and nursing note reviewed.   37 year old female, resting comfortably and in no acute distress. Vital signs are normal. Oxygen saturation is 96%, which is normal. Head is normocephalic and atraumatic. PERRLA, EOMI. Oropharynx is clear. Neck is nontender and supple without adenopathy. Lungs are clear without rales, wheezes, or rhonchi. Heart has regular rate and rhythm without murmur. Abdomen is soft, flat, nontender. Skin is warm and dry without rash. Neurologic: Awake and alert, moves all extremities equally.   Procedures   Medications Ordered in the ED  predniSONE  (DELTASONE ) tablet 60 mg (60 mg Oral Given 11/27/23 0319)  famotidine  (PEPCID ) tablet 20 mg (20 mg Oral Given 11/27/23 0319)                                    Medical Decision Making Risk Prescription drug management.   Allergic reaction.  Doubt alpha gal related rash as she did not ingest any meat that should trigger it.  Specific trigger for  this reaction is unclear.  She currently is not having any itching.  I have ordered famotidine , prednisone  orally.  I have observed her in the emergency department for 3 hours with no recurrence of symptoms.  She is safe for discharge.  I am discharging her with a prescription for prednisone , advised to continue using over-the-counter H1 and H2 blockers as needed.     Final diagnoses:  Allergic reaction, initial encounter    ED Discharge Orders          Ordered    predniSONE  (DELTASONE ) 50 MG tablet  Daily        11/27/23 0530               Raford Lenis, MD 11/27/23 [phone]

## 2023-11-28 ENCOUNTER — Ambulatory Visit: Admitting: Family Medicine

## 2023-11-28 ENCOUNTER — Encounter: Payer: Self-pay | Admitting: Family Medicine

## 2023-11-28 VITALS — BP 102/80 | HR 96 | Temp 98.6°F | Wt 241.1 lb

## 2023-11-28 DIAGNOSIS — Z91018 Allergy to other foods: Secondary | ICD-10-CM | POA: Insufficient documentation

## 2023-11-28 DIAGNOSIS — T7800XD Anaphylactic reaction due to unspecified food, subsequent encounter: Secondary | ICD-10-CM

## 2023-11-28 DIAGNOSIS — T7800XA Anaphylactic reaction due to unspecified food, initial encounter: Secondary | ICD-10-CM

## 2023-11-28 DIAGNOSIS — L500 Allergic urticaria: Secondary | ICD-10-CM | POA: Insufficient documentation

## 2023-11-28 MED ORDER — CETIRIZINE HCL 10 MG PO TABS: ORAL_TABLET | ORAL | 5 refills | Status: DC

## 2023-11-28 MED ORDER — FAMOTIDINE 20 MG PO TABS: ORAL_TABLET | ORAL | 5 refills | Status: DC

## 2023-11-28 NOTE — Patient Instructions (Addendum)
 Alpha gal allergy Continue to avoid mammalian products.  In case of an allergic reaction, take Benadryl  50 mg every 4 hours, and if life-threatening symptoms occur, inject with EpiPen  0.3 mg. A lab ordered to check your alpha gal level tryptase level.  A future order has been placed for tryptase to be drawn during an allergic reaction Consider Xolair injection for better food allergy control  Hives (urticaria) Take the least amount of medications while remaining hive free Cetirizine (Zyrtec) 10mg  twice a day and famotidine  (Pepcid ) 20 mg twice a day. If no symptoms for 7-14 days then decrease to. Cetirizine (Zyrtec) 10mg  twice a day and famotidine  (Pepcid ) 20 mg once a day.  If no symptoms for 7-14 days then decrease to. Cetirizine (Zyrtec) 10mg  twice a day.  If no symptoms for 7-14 days then decrease to. Cetirizine (Zyrtec) 10mg  once a day.  May use Benadryl  (diphenhydramine ) as needed for breakthrough hives       If symptoms return, then step up dosage  Keep a detailed symptom journal including foods eaten, contact with allergens, medications taken, weather changes.    Call the clinic if this treatment plan is not working well for you  Follow up in 3 months or sooner if needed.

## 2023-11-28 NOTE — Progress Notes (Signed)
 [address] [place] [address] Dept: [phone]  FOLLOW UP NOTE  Patient ID: Kelly Kim, female    DOB: 07/03/1985  Age: 38 y.o. MRN: [id number] Date of Office Visit: 11/28/2023  Assessment  Chief Complaint: Follow-up and Allergic Reaction (Had an allergic reaction yesterday- unknown)  HPI Kelly Kim is a 38 year old female who presents to the clinic for follow-up visit.  She was last seen in this clinic on 10/01/2023 as a new patient by Dr. Tobie for alpha gal allergy.  In the interim, she presented to the emergency department yesterday for evaluation of allergic reaction.  She reports that yesterday she had planet oat oat milk and [employer] crispy chicken tenders for the dinner for the first time yesterday.  Otherwise, she denies new foods, new medications, new personal care products, or recent illness.  She denies insect sting.  She denies recent tick bites. She reports about 2 hours after dinner she began to experience itch that began on her scalp and shortly after began to feel tongue swelling.  She took Benadryl  at that time and called EMS.  She eventually took a second Benadryl  when she noticed flushing with no relief of symptoms and went to the emergency department for further treatment.  At the emergency department she received famotidine  and oral steroid.  At today's visit, she reports that she is feeling less itchy, however, feels that her tongue may have slight swelling still.  She continues to avoid mammalian meat and dairy products.  Her last alpha gal allergy lab panel on 07/11/2023 indicates alpha gal IgE 9.92, lamb IgE 1.54, beef IgE 2.77, and pork IgE 0.3.  We briefly discussed Xolair for food allergy and written information was provided at today's visit.  She will call the clinic if she is interested in this option.  EpiPen  is up-to-date.  Her current medications are listed in the chart.     Drug Allergies:  No Known Allergies  Physical Exam: BP  102/80   Pulse 96   Temp 98.6 F (37 C)   Wt 241 lb 2 oz (109.4 kg)   LMP 11/15/2023 (Approximate)   SpO2 96%   BMI 34.60 kg/m    Physical Exam Vitals reviewed.  Constitutional:      Appearance: Normal appearance.  HENT:     Head: Normocephalic and atraumatic.     Right Ear: Tympanic membrane normal.     Left Ear: Tympanic membrane normal.     Nose:     Comments: Bilateral nares normal. Pharynx normal. Ears normal. Eyes normal.    Mouth/Throat:     Pharynx: Oropharynx is clear.   Eyes:     Conjunctiva/sclera: Conjunctivae normal.    Cardiovascular:     Rate and Rhythm: Normal rate and regular rhythm.     Heart sounds: Normal heart sounds. No murmur heard. Pulmonary:     Effort: Pulmonary effort is normal.     Breath sounds: Normal breath sounds.     Comments: Lungs clear to auscultaiton  Musculoskeletal:     Cervical back: Normal range of motion and neck supple.   Skin:    General: Skin is warm and dry.   Neurological:     Mental Status: She is alert and oriented to person, place, and time.   Psychiatric:        Mood and Affect: Mood normal.        Behavior: Behavior normal.        Thought Content: Thought  content normal.        Judgment: Judgment normal.     Assessment and Plan: 1. Allergy to alpha-gal   2. Allergic urticaria     Meds ordered this encounter  Medications   cetirizine (ZYRTEC ALLERGY) 10 MG tablet    Sig: You may take cetirizine once or twice a day if needed for hives or itch    Dispense:  60 tablet    Refill:  5   famotidine  (PEPCID ) 20 MG tablet    Sig: You may take famotidine  twice a day if needed for hives or itch    Dispense:  60 tablet    Refill:  5    Patient Instructions  Alpha gal allergy Continue to avoid mammalian products.  In case of an allergic reaction, take Benadryl  50 mg every 4 hours, and if life-threatening symptoms occur, inject with EpiPen  0.3 mg. A lab ordered to check your alpha gal level tryptase level.  A  future order has been placed for tryptase to be drawn during an allergic reaction Consider Xolair injection for better food allergy control  Hives (urticaria) Take the least amount of medications while remaining hive free Cetirizine (Zyrtec) 10mg  twice a day and famotidine  (Pepcid ) 20 mg twice a day. If no symptoms for 7-14 days then decrease to. Cetirizine (Zyrtec) 10mg  twice a day and famotidine  (Pepcid ) 20 mg once a day.  If no symptoms for 7-14 days then decrease to. Cetirizine (Zyrtec) 10mg  twice a day.  If no symptoms for 7-14 days then decrease to. Cetirizine (Zyrtec) 10mg  once a day.  May use Benadryl  (diphenhydramine ) as needed for breakthrough hives       If symptoms return, then step up dosage  Keep a detailed symptom journal including foods eaten, contact with allergens, medications taken, weather changes.    Call the clinic if this treatment plan is not working well for you  Follow up in 3 months or sooner if needed.   Return in about 3 months (around 02/28/2024), or if symptoms worsen or fail to improve.    Thank you for the opportunity to care for this patient.  Please do not hesitate to contact me with questions.  Arlean Mutter, FNP [facility] 

## 2023-12-03 ENCOUNTER — Telehealth: Payer: Self-pay | Admitting: Family Medicine

## 2023-12-03 NOTE — Telephone Encounter (Signed)
 Pt request a call back about a lab work request.

## 2023-12-03 NOTE — Addendum Note (Signed)
 Addended by: WILLIAMS, Pasquale Matters H on: 12/03/2023 05:45 PM   Modules accepted: Orders

## 2023-12-04 ENCOUNTER — Encounter (HOSPITAL_COMMUNITY): Payer: Self-pay | Admitting: Emergency Medicine

## 2023-12-04 ENCOUNTER — Other Ambulatory Visit: Payer: Self-pay

## 2023-12-04 ENCOUNTER — Emergency Department (HOSPITAL_COMMUNITY)
Admission: EM | Admit: 2023-12-04 | Discharge: 2023-12-05 | Disposition: A | Discharge status: Home / Self Care | Attending: Emergency Medicine | Admitting: Emergency Medicine

## 2023-12-04 DIAGNOSIS — R21 Rash and other nonspecific skin eruption: Secondary | ICD-10-CM | POA: Insufficient documentation

## 2023-12-04 DIAGNOSIS — Z91014 Allergy to mammalian meats: Secondary | ICD-10-CM | POA: Insufficient documentation

## 2023-12-04 HISTORY — DX: Allergy to mammalian meats: Z91.014

## 2023-12-04 MED ORDER — DEXAMETHASONE 4 MG PO TABS
8.0000 mg | ORAL_TABLET | Freq: Once | ORAL | Status: DC
  Filled 2023-12-04: qty 2

## 2023-12-04 NOTE — Telephone Encounter (Signed)
 I signed the new labs. Can you please call to se if we can add them on to what has been drawn? If not, she will need to come into any [facility] for a new draw. Thank you

## 2023-12-04 NOTE — ED Triage Notes (Signed)
 Pt c/o allergic rxn that started around 1800. Pt has alpha gal syndrome. Last 2 weeks each evening gets itchy including roof of mouth. Tonight endorses itching and hives on head. Pt has hives on right arm. Pt states has been mammal free for 4 months and thinks might be allergic to something else.

## 2023-12-04 NOTE — Progress Notes (Signed)
 Called pt and informed pt of the being added and she can go it drawn, sje report that she will be getting it done tomorrow.

## 2023-12-04 NOTE — Progress Notes (Signed)
 I signed the orders. Can you please see if we can still add them onto what was already drawn? If not she will need to go to any [facility] for a new draw. Thank you

## 2023-12-04 NOTE — Addendum Note (Signed)
 Addended by: Tynell Winchell M on: 12/04/2023 12:27 PM   Modules accepted: Orders

## 2023-12-04 NOTE — Telephone Encounter (Signed)
 Yes, I did, and she's going to get it done tomorrow.

## 2023-12-05 ENCOUNTER — Ambulatory Visit: Admitting: Family Medicine

## 2023-12-05 NOTE — Patient Instructions (Incomplete)
 Alpha gal allergy Continue to avoid mammalian products.  In case of an allergic reaction, take Benadryl  50 mg every 4 hours, and if life-threatening symptoms occur, inject with EpiPen  0.3 mg. A lab ordered to check your alpha gal level tryptase level.  A future order has been placed for tryptase to be drawn during an allergic reaction Consider Xolair injection for better food allergy control  Hives (urticaria) Take the least amount of medications while remaining hive free Cetirizine  (Zyrtec ) 10mg  twice a day and famotidine  (Pepcid ) 20 mg twice a day. If no symptoms for 7-14 days then decrease to. Cetirizine  (Zyrtec ) 10mg  twice a day and famotidine  (Pepcid ) 20 mg once a day.  If no symptoms for 7-14 days then decrease to. Cetirizine  (Zyrtec ) 10mg  twice a day.  If no symptoms for 7-14 days then decrease to. Cetirizine  (Zyrtec ) 10mg  once a day.  May use Benadryl  (diphenhydramine ) as needed for breakthrough hives       If symptoms return, then step up dosage  Keep a detailed symptom journal including foods eaten, contact with allergens, medications taken, weather changes.    Call the clinic if this treatment plan is not working well for you  Follow up in  months or sooner if needed.

## 2023-12-05 NOTE — ED Provider Notes (Signed)
 [place] EMERGENCY DEPARTMENT AT [facility] Provider Note   CSN: [id number] Arrival date & time: 12/04/23  2201     Patient presents with: Allergic Reaction   Kelly Kim is a 38 y.o. female.   The history is provided by the patient.  Allergic Reaction Presenting symptoms: itching and rash   Presenting symptoms: no difficulty breathing and no difficulty swallowing   Worsened by:  Nothing Patient reports she has recently been diagnosed with alpha gal syndrome.  She has avoided all mammalian products but still is having symptoms. She reports at times she feels that there is burning on her head and itching.  She also reports rash to her arms.  She also reports that she feels that her uvula is enlarged.  No fevers or vomiting.  No diarrhea.  No chest pain or shortness of breath.  No abdominal pain She says that she is supposed to have outpatient labs performed She reports due to fear of ingesting mammalian products accidentally, she is having difficulty eating and losing weight   She has EpiPen 's at home but has never used them Prior to Admission medications   Medication Sig Start Date End Date Taking? Authorizing Provider  Bacillus Coagulans-Inulin (PROBIOTIC) 1-250 BILLION-MG CAPS Take 1 capsule by mouth daily.    [provider]  cetirizine  (ZYRTEC  ALLERGY) 10 MG tablet You may take cetirizine  once or twice a day if needed for hives or itch 11/28/23   Ambs, Arlean HERO, FNP  clonazePAM (KLONOPIN) 0.5 MG tablet Take 0.5 mg by mouth 2 (two) times daily as needed for anxiety.    [provider]  EPINEPHrine  (EPIPEN  2-PAK) 0.3 mg/0.3 mL IJ SOAJ injection Inject 0.3 mg into the muscle as needed for anaphylaxis. 10/01/23   Tobie Arleta SQUIBB, MD  famotidine  (PEPCID ) 20 MG tablet You may take famotidine  twice a day if needed for hives or itch 11/28/23   Ambs, Arlean HERO, FNP  FLUoxetine (PROZAC) 10 MG capsule Take 10 mg by mouth daily. 04/04/23   [provider]   gabapentin  (NEURONTIN ) 100 MG capsule Take 1 capsule (100 mg total) by mouth 3 (three) times daily as needed. 08/16/23   Athar, Saima, MD  levothyroxine  (SYNTHROID ) 75 MCG tablet TAKE 1 TABLET BY MOUTH EVERY MORNING ON AN EMPTY STOMACH for 90    [provider]  Multiple Vitamin (MULTIVITAMIN) tablet Take 1 tablet by mouth daily. Patient not taking: Reported on 11/28/2023    [provider]  predniSONE  (DELTASONE ) 50 MG tablet Take 1 tablet (50 mg total) by mouth daily. 11/27/23   Raford Lenis, MD  ZEPBOUND  2.5 MG/0.5ML Pen INJECT 2.5MG  UNDER THE SKIN WEEKLY 30 DAYS Patient not taking: Reported on 11/28/2023 10/30/23   [provider]    Allergies: Patient has no known allergies.    Review of Systems  HENT:  Negative for trouble swallowing.   Skin:  Positive for itching and rash.    Updated Vital Signs BP 107/76   Pulse 87   Temp 98.4 F (36.9 C) (Oral)   Resp 18   Ht 1.778 m (5' 10)   Wt 106.8 kg   LMP 11/15/2023 (Approximate)   SpO2 98%   BMI 33.78 kg/m   Physical Exam CONSTITUTIONAL: Well developed/well nourished HEAD: Normocephalic/atraumatic EYES: EOMI/PERRL ENMT: Mucous membranes moist uvula midline without edema, no erythema, no angioedema, no stridor, no drooling NECK: supple no meningeal signs, no anterior neck edema CV: S1/S2 noted, no murmurs/rubs/gallops noted LUNGS: Lungs are clear  to auscultation bilaterally, no apparent distress ABDOMEN: soft, nontender  GU:no cva tenderness NEURO: Pt is awake/alert/appropriate, moves all extremitiesx4.  No facial droop.   SKIN: warm, color normal no rash or urticaria noted PSYCH: no abnormalities of mood noted, alert and oriented to situation  (all labs ordered are listed, but only abnormal results are displayed) Labs Reviewed  TRYPTASE    EKG: None  Radiology: No results found.   Procedures   Medications Ordered in the ED - No data to display                                   Medical Decision Making Amount and/or Complexity of Data Reviewed Labs: ordered.   Patient presents with continued allergic type symptoms despite avoiding all mammalian products after being diagnosed with alpha gal Clinically she is in no acute distress, no signs of anaphylaxis at this time.  I did offer one-time dose of Decadron  for her symptoms but she declined due to fear of occult exposure to mammalian products  I did offer lab draw, her tryptase has been ordered.  Urgent referral has been placed back to allergy specialist      Final diagnoses:  Alpha-gal syndrome    ED Discharge Orders          Ordered    Ambulatory referral to Allergy        12/04/23 2349               Midge Golas, MD 12/05/23 0009

## 2023-12-06 ENCOUNTER — Ambulatory Visit: Admitting: Family

## 2023-12-07 LAB — TRYPTASE: Tryptase: 3.5 ug/L (ref 2.2–13.2)

## 2023-12-09 NOTE — Progress Notes (Deleted)
 Follow Up Note  RE: Kelly Kim MRN: [id number] DOB: Aug 05, 1985 Date of Office Visit: 12/10/2023  Referring provider: Nichole Senior, MD Primary care provider: Nichole Senior, MD  Chief Complaint: No chief complaint on file.  History of Present Illness: I had the pleasure of seeing Kelly Kim for a follow up visit at the [facility] on 12/10/2023. She is a 38 y.o. female, who is being followed for alpha gal allergy and hives. Her previous allergy office visit was on 11/28/2023 with Arlean Mutter, FNP. Today is a new complaint visit of follow-up from ER visit.  Discussed the use of AI scribe software for clinical note transcription with the patient, who gave verbal consent to proceed.  History of Present Illness            12/04/2023 ER note: Patient presents with continued allergic type symptoms despite avoiding all mammalian products after being diagnosed with alpha gal Clinically she is in no acute distress, no signs of anaphylaxis at this time.  I did offer one-time dose of Decadron  for her symptoms but she declined due to fear of occult exposure to mammalian products   I did offer lab draw, her tryptase has been ordered.  Urgent referral has been placed back to allergy specialist  Assessment and Plan: Kelly Kim is a 38 y.o. female with: Alpha gal allergy Continue to avoid mammalian products.  In case of an allergic reaction, take Benadryl  50 mg every 4 hours, and if life-threatening symptoms occur, inject with EpiPen  0.3 mg. A lab ordered to check your alpha gal level tryptase level.  A future order has been placed for tryptase to be drawn during an allergic reaction Consider Xolair injection for better food allergy control   Hives (urticaria) Take the least amount of medications while remaining hive free Cetirizine  (Zyrtec ) 10mg  twice a day and famotidine  (Pepcid ) 20 mg twice a day. If no symptoms for 7-14 days then decrease to. Cetirizine  (Zyrtec ) 10mg   twice a day and famotidine  (Pepcid ) 20 mg once a day.  If no symptoms for 7-14 days then decrease to. Cetirizine  (Zyrtec ) 10mg  twice a day.  If no symptoms for 7-14 days then decrease to. Cetirizine  (Zyrtec ) 10mg  once a day.   May use Benadryl  (diphenhydramine ) as needed for breakthrough hives       If symptoms return, then step up dosage   Keep a detailed symptom journal including foods eaten, contact with allergens, medications taken, weather changes.  Assessment and Plan              No follow-ups on file.  No orders of the defined types were placed in this encounter.  Lab Orders  No laboratory test(s) ordered today    Diagnostics: Spirometry:  Tracings reviewed. Her effort: {Blank single:19197::Good reproducible efforts.,It was hard to get consistent efforts and there is a question as to whether this reflects a maximal maneuver.,Poor effort, data can not be interpreted.} FVC: ***L FEV1: ***L, ***% predicted FEV1/FVC ratio: ***% Interpretation: {Blank single:19197::Spirometry consistent with mild obstructive disease,Spirometry consistent with moderate obstructive disease,Spirometry consistent with severe obstructive disease,Spirometry consistent with possible restrictive disease,Spirometry consistent with mixed obstructive and restrictive disease,Spirometry uninterpretable due to technique,Spirometry consistent with normal pattern,No overt abnormalities noted given today's efforts}.  Please see scanned spirometry results for details.  Skin Testing: {Blank single:19197::Select foods,Environmental allergy panel,Environmental allergy panel and select foods,Food allergy panel,None,Deferred due to recent antihistamines use}. *** Results discussed with patient/family.   Medication List:  Current Outpatient Medications  Medication Sig Dispense Refill   Bacillus Coagulans-Inulin (PROBIOTIC) 1-250 BILLION-MG CAPS Take 1 capsule by mouth daily.      cetirizine  (ZYRTEC  ALLERGY) 10 MG tablet You may take cetirizine  once or twice a day if needed for hives or itch 60 tablet 5   clonazePAM (KLONOPIN) 0.5 MG tablet Take 0.5 mg by mouth 2 (two) times daily as needed for anxiety.     EPINEPHrine  (EPIPEN  2-PAK) 0.3 mg/0.3 mL IJ SOAJ injection Inject 0.3 mg into the muscle as needed for anaphylaxis. 2 each 0   famotidine  (PEPCID ) 20 MG tablet You may take famotidine  twice a day if needed for hives or itch 60 tablet 5   FLUoxetine (PROZAC) 10 MG capsule Take 10 mg by mouth daily.     gabapentin  (NEURONTIN ) 100 MG capsule Take 1 capsule (100 mg total) by mouth 3 (three) times daily as needed. 90 capsule 5   levothyroxine  (SYNTHROID ) 75 MCG tablet TAKE 1 TABLET BY MOUTH EVERY MORNING ON AN EMPTY STOMACH for 90     Multiple Vitamin (MULTIVITAMIN) tablet Take 1 tablet by mouth daily. (Patient not taking: Reported on 11/28/2023)     predniSONE  (DELTASONE ) 50 MG tablet Take 1 tablet (50 mg total) by mouth daily. 5 tablet 0   ZEPBOUND  2.5 MG/0.5ML Pen INJECT 2.5MG  UNDER THE SKIN WEEKLY 30 DAYS (Patient not taking: Reported on 11/28/2023)     No current facility-administered medications for this visit.   Allergies: No Known Allergies I reviewed her past medical history, social history, family history, and environmental history and no significant changes have been reported from her previous visit.  Review of Systems  Constitutional:  Negative for appetite change, chills, fever and unexpected weight change.  HENT:  Negative for congestion and rhinorrhea.   Eyes:  Negative for itching.  Respiratory:  Negative for cough, chest tightness, shortness of breath and wheezing.   Cardiovascular:  Negative for chest pain.  Gastrointestinal:  Negative for abdominal pain.  Genitourinary:  Negative for difficulty urinating.  Skin:  Negative for rash.  Allergic/Immunologic: Positive for food allergies.  Neurological:  Negative for headaches.    Objective: LMP  11/15/2023 (Approximate)  There is no height or weight on file to calculate BMI. Physical Exam Vitals and nursing note reviewed.  Constitutional:      Appearance: Normal appearance. She is well-developed.  HENT:     Head: Normocephalic and atraumatic.     Right Ear: Tympanic membrane and external ear normal.     Left Ear: Tympanic membrane and external ear normal.     Nose: Nose normal.     Mouth/Throat:     Mouth: Mucous membranes are moist.     Pharynx: Oropharynx is clear.  Eyes:     Conjunctiva/sclera: Conjunctivae normal.  Cardiovascular:     Rate and Rhythm: Normal rate and regular rhythm.     Heart sounds: Normal heart sounds. No murmur heard.    No friction rub. No gallop.  Pulmonary:     Effort: Pulmonary effort is normal.     Breath sounds: Normal breath sounds. No wheezing, rhonchi or rales.  Musculoskeletal:     Cervical back: Neck supple.  Skin:    General: Skin is warm.     Findings: No rash.  Neurological:     Mental Status: She is alert and oriented to person, place, and time.  Psychiatric:        Behavior: Behavior normal.    Previous notes and tests were reviewed. The plan  was reviewed with the patient/family, and all questions/concerned were addressed.  It was my pleasure to see Aamna today and participate in her care. Please feel free to contact me with any questions or concerns.  Sincerely,  Orlan Cramp, DO Allergy & Immunology  [facility]  [place] office: [phone] [facility] office: [phone]

## 2023-12-10 ENCOUNTER — Ambulatory Visit: Admitting: Allergy & Immunology

## 2023-12-10 ENCOUNTER — Encounter: Payer: Self-pay | Admitting: Allergy & Immunology

## 2023-12-10 ENCOUNTER — Ambulatory Visit: Admitting: Allergy

## 2023-12-10 VITALS — BP 102/72 | HR 95 | Temp 97.2°F | Wt 234.5 lb

## 2023-12-10 DIAGNOSIS — L508 Other urticaria: Secondary | ICD-10-CM | POA: Diagnosis not present

## 2023-12-10 DIAGNOSIS — Z91018 Allergy to other foods: Secondary | ICD-10-CM

## 2023-12-10 NOTE — Progress Notes (Unsigned)
 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 Allergy to alpha-gal - Please get the other labs to look for other food allergens that were ordered at the last visit. - Talk to a pharmacist to try to get rid of any medications with capsules in case that is a sourse of continued exposure.  - We will try to see what will happen with your Medicaid coverage before adding on the Xolair.  - Continue to avoid red meat and dairy as you were doing.  - I would ike to add on dairy, but I would like Xolair on board first.  - You could try using half of your Klonopin to see if that clears it up at all.  - Emergency Action Plan reviewed.  2. Return in about 3 months (around 03/11/2024). You can have the follow up appointment with Dr. Iva or a Nurse Practicioner (our Nurse Practitioners are excellent and always have Physician oversight!).    Subjective:   Jenay Stiff is a 38 y.o. female presenting today for follow up of  Chief Complaint  Patient presents with   Follow-up    Shamecca Sahakian has a history of the following: Patient Active Problem List   Diagnosis Date Noted   Allergy to alpha-gal 11/28/2023   Allergic urticaria 11/28/2023   Chest pain    Hx: UTI (urinary tract infection)    Palpitations    Premenstrual tension syndrome    Shortness of breath    Thyroid  disease    Blood glucose abnormal 09/10/2023   Anxiety 09/10/2023   Gastroesophageal reflux disease 09/10/2023   Hypothyroid 09/10/2023   Menorrhagia 09/10/2023   Migraine 09/10/2023   Morbid obesity (HCC) 09/10/2023   Obstructive sleep apnea 09/10/2023   Premenstrual dysphoric disorder 09/10/2023   Proteinuria 09/10/2023   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  Discussed the use of AI scribe software for clinical note transcription with the patient and/or guardian, who gave verbal consent to proceed.  Marybell is a 38 y.o. female presenting for a follow up visit.  She was last seen in June 2025.  At that time, she continued to avoid alpha gal and mammalian products.  She had her tryptase and alpha gal levels redrawn.  We did talk about Xolair for management of cross-contamination episodes.  For her hives, she was continued on suppressive doses of antihistamines.  These labs were drawn.  Interim, she did go to the emergency room on July 1.  Per the note, she was not in any acute distress and did not have signs of anaphylaxis.  She refused a dose of Decadron .  A tryptase was drawn which was completely normal.  Recently, she experienced severe itching on her scalp after dinner, prompting an ER visit. A tryptase test was performed, which returned normal results. She did not use her EpiPen  during this episode and noted improvement after taking Zyrtec  and famotidine .  She has a history of severe allergic reactions, with a significant episode occurring a decade ago. She has been avoiding mammalian meats and other potential allergens, which has significantly  impacted her life. She experiences anxiety about potential allergic reactions, especially when symptoms like itching occur.  She is currently taking 0.5 mg Klonopin and a low dose of Prozac for anxiety, which she has discussed with her mental health provider. She is cautious about increasing her Prozac dose due to concerns about the capsule's contents.  She has a history of liver lesions identified as hemangiomas, discovered during a workup for severe abdominal pain and heart palpitations. She underwent CT  scans and an elimination diet, which led to the alpha-gal diagnosis after experiencing vomiting upon reintroducing meat.  She is concerned about her Medicaid coverage, which is currently uncertain due to missed deadlines for reapplication. 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  No current hives, but symptoms, including itching, tend to worsen before her menstrual period. She is currently menstruating and reports feeling better.  Otherwise, there have been no changes to her past medical history, surgical history, family history, or social history.    Review of systems otherwise negative other than that mentioned in the HPI.    Objective:   Blood pressure 102/72, pulse 95, temperature (!) 97.2 F (36.2 C), weight 234 lb 8 oz (106.4 kg), last menstrual period 11/15/2023, SpO2 96%. Body mass index is 33.65 kg/m.    Physical Exam Constitutional:      Appearance: She is well-developed.  HENT:     Head: Normocephalic and atraumatic.     Right Ear: Tympanic membrane, ear canal and external ear normal.     Left Ear: Tympanic membrane, ear canal and external ear normal.     Nose: No nasal deformity, septal deviation, mucosal edema or rhinorrhea.     Right Turbinates: Enlarged, swollen and pale.     Left Turbinates: Enlarged, swollen and pale.     Right Sinus: No maxillary sinus tenderness or frontal sinus tenderness.     Left Sinus: No maxillary sinus tenderness or frontal sinus tenderness.     Mouth/Throat:     Mouth: Mucous membranes are not pale and not dry.     Pharynx: Uvula midline.  Eyes:     General: Lids are normal. No allergic shiner.       Right eye: No discharge.        Left eye: No discharge.     Conjunctiva/sclera: Conjunctivae normal.     Right eye: Right conjunctiva is not injected. No chemosis.    Left eye: Left conjunctiva is not injected. No chemosis.    Pupils: Pupils are equal, round, and reactive to  light.  Cardiovascular:     Rate and Rhythm: Normal rate and regular rhythm.     Heart sounds: Normal heart sounds.  Pulmonary:     Effort: Pulmonary effort is normal. No tachypnea, accessory muscle usage or respiratory distress.     Breath sounds: Normal breath sounds. No wheezing, rhonchi or rales.  Chest:     Chest wall: No tenderness.  Lymphadenopathy:     Cervical: No cervical adenopathy.  Skin:    General: Skin is warm.     Capillary Refill: Capillary refill takes less than 2 seconds.     Coloration: Skin is not pale.     Findings: No abrasion, erythema, petechiae or rash. Rash is not papular, urticarial or vesicular.     Comments: She has some resolving urticarial lesions on her bilateral legs and arms.  Neurological:     Mental Status: She is alert.  Psychiatric:  Behavior: Behavior is cooperative.      Diagnostic studies: none      Marty Shaggy, MD  [facility] 

## 2023-12-10 NOTE — Patient Instructions (Addendum)
 1. Allergy to alpha-gal - Please get the other labs to look for other food allergens that were ordered at the last visit. - Talk to a pharmacist to try to get rid of any medications with capsules in case that is a course of continued exposure.  - We will try to see what will happen with your Medicaid coverage before adding on the Xolair.  - Continue to avoid red meat and dairy as you were doing.  - I would ike to add on dairy, but I would like Xolair on board first.  - You could try using half of your Klonopin to see if that clears it up at all.  - Emergency Action Plan reviewed.  2. Return in about 3 months (around 03/11/2024). You can have the follow up appointment with Dr. Iva or a Nurse Practicioner (our Nurse Practitioners are excellent and always have Physician oversight!).    Please inform us  of any Emergency Department visits, hospitalizations, or changes in symptoms. Call us  before going to the ED for breathing or allergy symptoms since we might be able to fit you in for a sick visit. Feel free to contact us  anytime with any questions, problems, or concerns.  It was a pleasure to meet you today!  Websites that have reliable patient information: 1. American Academy of Asthma, Allergy, and Immunology: www.aaaai.org 2. Food Allergy Research and Education (FARE): foodallergy.org 3. Mothers of Asthmatics: http://www.asthmacommunitynetwork.org 4. American College of Allergy, Asthma, and Immunology: www.acaai.org      "Like" us  on Facebook and Instagram for our latest updates!      A healthy democracy works best when [employer] participate! Make sure you are registered to vote! If you have moved or changed any of your contact information, you will need to get this updated before voting! Scan the QR codes below to learn more!

## 2023-12-11 ENCOUNTER — Encounter: Payer: Self-pay | Admitting: Allergy & Immunology

## 2023-12-11 DIAGNOSIS — F411 Generalized anxiety disorder: Secondary | ICD-10-CM | POA: Diagnosis not present

## 2023-12-11 DIAGNOSIS — F330 Major depressive disorder, recurrent, mild: Secondary | ICD-10-CM | POA: Diagnosis not present

## 2023-12-13 LAB — ALLERGEN, CORN F8: Allergen Corn, IgE: <0.1 kU/L

## 2023-12-13 LAB — F245-IGE EGG, WHOLE: Egg, Whole IgE: <0.1 kU/L

## 2023-12-13 LAB — ALLERGEN, WHEAT, F4: Wheat IgE: <0.1 kU/L

## 2023-12-14 ENCOUNTER — Ambulatory Visit: Payer: Self-pay | Admitting: Family Medicine

## 2023-12-14 ENCOUNTER — Encounter: Payer: Self-pay | Admitting: Allergy & Immunology

## 2023-12-14 LAB — ALPHA-GAL PANEL
Allergen Lamb IgE: 1.34 kU/L — AB
Beef IgE: 2.07 kU/L — AB
IgE (Immunoglobulin E), Serum: 84 [IU]/mL (ref 6–495)
O215-IgE Alpha-Gal: 6.87 kU/L — AB
Pork IgE: 0.3 kU/L — AB

## 2023-12-14 LAB — TRYPTASE: Tryptase: 3.9 ug/L (ref 2.2–13.2)

## 2023-12-14 NOTE — Progress Notes (Signed)
 Can you please let this patient know that her alpha gal IgE remains elevated, however, is lower than previous testing.  Components including lamb, beef, and pork remain elevated as well however, each 1 is close elements is lower than previous testing 5 months ago.  Please have her continue to avoid mammalian products and have access to a set of epinephrine  autoinjector pens.  Thank you

## 2023-12-17 NOTE — Telephone Encounter (Signed)
 What dose would you like to use based on feb or July labs dose for feb 450 Q 4 and July 300 Q 4

## 2023-12-18 ENCOUNTER — Telehealth: Payer: Self-pay | Admitting: *Deleted

## 2023-12-18 DIAGNOSIS — F909 Attention-deficit hyperactivity disorder, unspecified type: Secondary | ICD-10-CM | POA: Diagnosis not present

## 2023-12-18 DIAGNOSIS — F411 Generalized anxiety disorder: Secondary | ICD-10-CM | POA: Diagnosis not present

## 2023-12-18 DIAGNOSIS — F32A Depression, unspecified: Secondary | ICD-10-CM | POA: Diagnosis not present

## 2023-12-18 MED ORDER — XOLAIR 300 MG/2ML ~~LOC~~ SOSY
300.0000 mg | PREFILLED_SYRINGE | SUBCUTANEOUS | 11 refills | Status: AC
  Filled 2023-12-21: qty 2, 28d supply, fill #0
  Filled 2024-01-21 – 2024-01-23 (×2): qty 2, 28d supply, fill #1
  Filled 2024-02-13: qty 2, 28d supply, fill #2
  Filled 2024-03-14: qty 2, 28d supply, fill #3
  Filled 2024-05-09 – 2024-05-15 (×2): qty 2, 28d supply, fill #4

## 2023-12-18 NOTE — Telephone Encounter (Signed)
-----   Message from Kelly Kim sent at 12/11/2023  8:12 AM EDT ----- She is still interested in Xolair , but she is concerned with her lapse in Medicaid coverage. If we start it and she loses Medicaid, can she switch to a free drug program?

## 2023-12-18 NOTE — Telephone Encounter (Signed)
 Called patient and advised approval and submit to [facility] for XOlair . Will reach out once delivery set to make appt to start therapy with one hr wait with initial injection and at least 3 injs in clinic

## 2023-12-19 ENCOUNTER — Other Ambulatory Visit (HOSPITAL_COMMUNITY): Payer: Self-pay

## 2023-12-19 ENCOUNTER — Other Ambulatory Visit: Payer: Self-pay

## 2023-12-19 NOTE — Telephone Encounter (Signed)
 Thank you :)

## 2023-12-21 ENCOUNTER — Other Ambulatory Visit: Payer: Self-pay

## 2023-12-21 ENCOUNTER — Other Ambulatory Visit (HOSPITAL_COMMUNITY): Payer: Self-pay

## 2023-12-21 DIAGNOSIS — F411 Generalized anxiety disorder: Secondary | ICD-10-CM | POA: Diagnosis not present

## 2023-12-21 DIAGNOSIS — F330 Major depressive disorder, recurrent, mild: Secondary | ICD-10-CM | POA: Diagnosis not present

## 2023-12-21 NOTE — Progress Notes (Signed)
 Specialty Pharmacy Initial Fill Coordination Note  Kelly Kim is a 38 y.o. female contacted today regarding initial fill of specialty medication(s) Omalizumab  (Xolair )   Patient requested Courier to Provider Office   Delivery date: 12/25/23   Verified address: [facility] [address] [place], [place] [address]   Medication will be filled on 12/24/23.   Patient is aware of $4 copayment.  Patient authorized pharmacy to charge card on file monthly

## 2023-12-21 NOTE — Progress Notes (Signed)
 Specialty Pharmacy Initiation Note   Kelly Kim is a 38 y.o. female who will be followed by the specialty pharmacy service for RxSp Allergy    Review of administration, indication, effectiveness, safety, potential side effects, storage/disposable, and missed dose instructions occurred today for patient's specialty medication(s) Omalizumab  (Xolair )     Patient/Caregiver did not have any additional questions or concerns.   Patient's therapy is appropriate to: Initiate    Goals Addressed             This Visit's Progress    Minimize recurrence of flares       Patient is initiating therapy. Patient will maintain adherence and adhere to provider and/or lab appointments         [facility] Specialty Pharmacist

## 2023-12-24 ENCOUNTER — Other Ambulatory Visit: Payer: Self-pay

## 2023-12-25 NOTE — Telephone Encounter (Signed)
 L/m for patient to call to schedule appt to start Xolair  with one hour wait after same

## 2023-12-28 DIAGNOSIS — F411 Generalized anxiety disorder: Secondary | ICD-10-CM | POA: Diagnosis not present

## 2023-12-28 DIAGNOSIS — F330 Major depressive disorder, recurrent, mild: Secondary | ICD-10-CM | POA: Diagnosis not present

## 2023-12-31 ENCOUNTER — Encounter: Payer: Self-pay | Admitting: Gastroenterology

## 2023-12-31 ENCOUNTER — Other Ambulatory Visit: Payer: Self-pay

## 2023-12-31 ENCOUNTER — Emergency Department (HOSPITAL_COMMUNITY)
Admission: EM | Admit: 2023-12-31 | Discharge: 2023-12-31 | Discharge status: Against Medical Advice | Attending: Emergency Medicine | Admitting: Emergency Medicine

## 2023-12-31 ENCOUNTER — Encounter (HOSPITAL_COMMUNITY): Payer: Self-pay

## 2023-12-31 DIAGNOSIS — T7840XA Allergy, unspecified, initial encounter: Secondary | ICD-10-CM | POA: Insufficient documentation

## 2023-12-31 DIAGNOSIS — Z5321 Procedure and treatment not carried out due to patient leaving prior to being seen by health care provider: Secondary | ICD-10-CM | POA: Insufficient documentation

## 2023-12-31 NOTE — ED Triage Notes (Addendum)
 Pt c/o allergic reaction that started last night. Pt is having itching in mouth and ears. States that she has been taking benadryl  and it helps temporarily. Endorses some trouble swallowing and feelings on tongue swelling. Pt did not take her epi pen because she states she is scared to use it. Pt has alpha gal. This has been a reoccurring thing for pt.   Appears to be in no distress and able to speak clear sentences.

## 2024-01-02 ENCOUNTER — Ambulatory Visit (INDEPENDENT_AMBULATORY_CARE_PROVIDER_SITE_OTHER)

## 2024-01-02 ENCOUNTER — Emergency Department (HOSPITAL_COMMUNITY)
Admission: EM | Admit: 2024-01-02 | Discharge: 2024-01-02 | Disposition: A | Discharge status: Home / Self Care | Attending: Emergency Medicine | Admitting: Emergency Medicine

## 2024-01-02 ENCOUNTER — Other Ambulatory Visit: Payer: Self-pay

## 2024-01-02 ENCOUNTER — Encounter (HOSPITAL_COMMUNITY): Payer: Self-pay

## 2024-01-02 DIAGNOSIS — L299 Pruritus, unspecified: Secondary | ICD-10-CM | POA: Diagnosis present

## 2024-01-02 DIAGNOSIS — Z91018 Allergy to other foods: Secondary | ICD-10-CM | POA: Diagnosis not present

## 2024-01-02 DIAGNOSIS — T7840XA Allergy, unspecified, initial encounter: Secondary | ICD-10-CM | POA: Insufficient documentation

## 2024-01-02 MED ORDER — PREDNISONE 50 MG PO TABS
60.0000 mg | ORAL_TABLET | Freq: Once | ORAL | Status: AC
  Administered 2024-01-02: 60 mg via ORAL
  Filled 2024-01-02: qty 1

## 2024-01-02 MED ORDER — SODIUM CHLORIDE 0.9 % IV BOLUS: 1000.0000 mL | Freq: Once | INTRAVENOUS | Status: DC

## 2024-01-02 MED ORDER — METHYLPREDNISOLONE SODIUM SUCC 125 MG IJ SOLR: 125.0000 mg | Freq: Once | INTRAMUSCULAR | Status: DC

## 2024-01-02 MED ORDER — FAMOTIDINE 20 MG PO TABS
20.0000 mg | ORAL_TABLET | Freq: Once | ORAL | Status: AC
  Administered 2024-01-02: 20 mg via ORAL
  Filled 2024-01-02: qty 1

## 2024-01-02 MED ORDER — OMALIZUMAB 300 MG/2  ML ~~LOC~~ SOSY
300.0000 mg | PREFILLED_SYRINGE | SUBCUTANEOUS | Status: AC
  Administered 2024-01-02 – 2024-02-25 (×3): 300 mg via SUBCUTANEOUS

## 2024-01-02 MED ORDER — DIPHENHYDRAMINE HCL 25 MG PO CAPS
50.0000 mg | ORAL_CAPSULE | Freq: Once | ORAL | Status: AC
  Administered 2024-01-02: 50 mg via ORAL
  Filled 2024-01-02: qty 2

## 2024-01-02 MED ORDER — DIPHENHYDRAMINE HCL 50 MG/ML IJ SOLN: 50.0000 mg | Freq: Once | INTRAMUSCULAR | Status: DC

## 2024-01-02 MED ORDER — PREDNISONE 10 MG PO TABS: 40.0000 mg | ORAL_TABLET | Freq: Every day | ORAL | 0 refills | Status: AC

## 2024-01-02 MED ORDER — FAMOTIDINE IN NACL 20-0.9 MG/50ML-% IV SOLN: 20.0000 mg | Freq: Once | INTRAVENOUS | Status: DC

## 2024-01-02 NOTE — Discharge Instructions (Signed)
 Please follow-up closely with your primary care doctor on an outpatient basis.  Return to emergency department immediately for any new or worsening symptoms.  Start taking the prednisone  tomorrow.  Continue taking Benadryl  approximate every 6 hours.

## 2024-01-02 NOTE — ED Triage Notes (Signed)
 Pt arrived via POV for concerns of possible allergic reaction. Pt reports receiving her first inject of Xolair  today as well. Pt reports having difficulty swallowing, feeling behind her ears itching and her head as well. Pt also concerned she may be experiencing a panic attack. Pt reports she was scared to use her Epi Pen.

## 2024-01-02 NOTE — Progress Notes (Signed)
 Immunotherapy   Patient Details  Name: Kelly Kim MRN: [id number] Date of Birth: 10/22/1985  01/02/2024  [name] started injections for  Xolair  Following schedule: Every twenty eight days.  Frequency:Every four weeks.  Epi-Pen:Epi-Pen Available  Consent signed in office today and patient instructions given. Patient sat in the lobby for sixty minutes without an issue.    [name] 01/02/2024, 10:04 AM

## 2024-01-02 NOTE — ED Provider Notes (Signed)
 [place] EMERGENCY DEPARTMENT AT [facility] Provider Note   CSN: [id number] Arrival date & time: 01/02/24  1603     Patient presents with: Allergic Reaction   Kelly Kim is a 38 y.o. female.   Patient is a 38 year old female who presents emergency department with a chief complaint of itching on her scalp, difficulty swallowing which began approximately an hour prior to arrival.  Patient notes that she did not use her EpiPen .  She notes that she did get her first Xolair  today.  She does admit to a history of alpha gal.  She denies any intake of red meat.  She denies any active chest pain, shortness of breath, abdominal pain, nausea, vomiting, diarrhea.   Allergic Reaction      Prior to Admission medications   Medication Sig Start Date End Date Taking? Authorizing Provider  omalizumab  (XOLAIR ) 300 MG/2  ML prefilled syringe Inject 300 mg into the skin every 28 (twenty-eight) days. 12/18/23  Yes Iva Marty Saltness, MD  predniSONE  (DELTASONE ) 10 MG tablet Take 4 tablets (40 mg total) by mouth daily for 4 days. 01/02/24 01/06/24 Yes Celsey Asselin, Lonni D, PA-C  Bacillus Coagulans-Inulin (PROBIOTIC) 1-250 BILLION-MG CAPS Take 1 capsule by mouth daily.    [provider]  busPIRone (BUSPAR) 15 MG tablet Take 7.5-15 mg by mouth 2 (two) times daily. 12/18/23   [provider]  cetirizine  (ZYRTEC  ALLERGY) 10 MG tablet You may take cetirizine  once or twice a day if needed for hives or itch 11/28/23   Ambs, Arlean HERO, FNP  clonazePAM (KLONOPIN) 0.5 MG tablet Take 0.5 mg by mouth 2 (two) times daily as needed for anxiety.    [provider]  EPINEPHrine  (EPIPEN  2-PAK) 0.3 mg/0.3 mL IJ SOAJ injection Inject 0.3 mg into the muscle as needed for anaphylaxis. 10/01/23   Tobie Arleta SQUIBB, MD  escitalopram (LEXAPRO) 5 MG tablet Take 5 mg by mouth daily. 12/28/23   [provider]  famotidine  (PEPCID ) 20 MG tablet You may take famotidine  twice a day if needed  for hives or itch 11/28/23   Ambs, Arlean HERO, FNP  FLUoxetine (PROZAC) 20 MG tablet Take 20 mg by mouth daily. 12/11/23   [provider]  gabapentin  (NEURONTIN ) 100 MG capsule Take 1 capsule (100 mg total) by mouth 3 (three) times daily as needed. 08/16/23   Athar, Saima, MD  levothyroxine  (SYNTHROID ) 75 MCG tablet TAKE 1 TABLET BY MOUTH EVERY MORNING ON AN EMPTY STOMACH for 90    [provider]  Multiple Vitamin (MULTIVITAMIN) tablet Take 1 tablet by mouth daily. Patient not taking: Reported on 12/10/2023    [provider]  ZEPBOUND  2.5 MG/0.5ML Pen INJECT 2.5MG  UNDER THE SKIN WEEKLY 30 DAYS Patient not taking: Reported on 12/10/2023 10/30/23   [provider]    Allergies: Alpha-gal    Review of Systems  Skin:        Pruritus  All other systems reviewed and are negative.   Updated Vital Signs BP 119/88 (BP Location: Right Arm)   Pulse 97   Temp 99.3 F (37.4 C) (Oral)   Resp 16   Ht 5' 10 (1.778 m)   Wt 106.8 kg   LMP 12/10/2023 (Exact Date)   SpO2 99%   BMI 33.77 kg/m   Physical Exam Vitals and nursing note reviewed.  Constitutional:      General: She is not in acute distress.    Appearance: Normal appearance. She is not ill-appearing.  HENT:  Head: Normocephalic and atraumatic.     Nose: Nose normal.     Mouth/Throat:     Mouth: Mucous membranes are moist.     Pharynx: No oropharyngeal exudate or posterior oropharyngeal erythema.     Comments: No mucosal swelling Eyes:     Extraocular Movements: Extraocular movements intact.     Conjunctiva/sclera: Conjunctivae normal.     Pupils: Pupils are equal, round, and reactive to light.  Cardiovascular:     Rate and Rhythm: Normal rate and regular rhythm.     Pulses: Normal pulses.     Heart sounds: Normal heart sounds. No murmur heard.    No gallop.  Pulmonary:     Effort: Pulmonary effort is normal. No respiratory distress.     Breath sounds: Normal breath sounds. No stridor. No  wheezing, rhonchi or rales.  Abdominal:     General: Abdomen is flat. Bowel sounds are normal. There is no distension.     Palpations: Abdomen is soft.     Tenderness: There is no abdominal tenderness. There is no guarding.  Musculoskeletal:        General: Normal range of motion.     Cervical back: Normal range of motion and neck supple. No rigidity or tenderness.  Skin:    General: Skin is warm and dry.     Findings: No bruising or rash.  Neurological:     General: No focal deficit present.     Mental Status: She is alert and oriented to person, place, and time. Mental status is at baseline.  Psychiatric:        Mood and Affect: Mood normal.        Behavior: Behavior normal.        Thought Content: Thought content normal.        Judgment: Judgment normal.     (all labs ordered are listed, but only abnormal results are displayed) Labs Reviewed - No data to display  EKG: None  Radiology: No results found.   Procedures   Medications Ordered in the ED  famotidine  (PEPCID ) tablet 20 mg (20 mg Oral Given 01/02/24 1649)  diphenhydrAMINE  (BENADRYL ) capsule 50 mg (50 mg Oral Given 01/02/24 1649)  predniSONE  (DELTASONE ) tablet 60 mg (60 mg Oral Given 01/02/24 1649)                                    Medical Decision Making Patient is doing well at this time and is stable for discharge home.  She was monitored in the emergency department for approximately 2 hours and is requesting discharge at this time.  Will continue Benadryl  and steroids at home.  Patient has had no indication for anaphylaxis and did not have to receive epinephrine .  Vital signs have remained stable.  Discussed with patient with the need for close follow-up with her primary care doctor on an outpatient basis as well as strict turn precautions for any new or worsening symptoms.  Patient voiced understanding and had no additional questions.  Risk Prescription drug management.        Final diagnoses:   Allergic reaction, initial encounter    ED Discharge Orders          Ordered    predniSONE  (DELTASONE ) 10 MG tablet  Daily        01/02/24 1803               Daralene Lonni BIRCH,  PA-C 01/02/24 1809    Suzette Pac, MD 01/04/24 1426

## 2024-01-04 DIAGNOSIS — F411 Generalized anxiety disorder: Secondary | ICD-10-CM | POA: Diagnosis not present

## 2024-01-04 DIAGNOSIS — F330 Major depressive disorder, recurrent, mild: Secondary | ICD-10-CM | POA: Diagnosis not present

## 2024-01-04 MED ORDER — NEFFY 2 MG/0.1ML NA SOLN: 2.0000 mg | NASAL | 1 refills | Status: DC | PRN

## 2024-01-08 ENCOUNTER — Telehealth: Payer: Self-pay

## 2024-01-08 NOTE — Telephone Encounter (Signed)
*  AA  Pharmacy Patient Advocate Encounter   Received notification from CoverMyMeds that prior authorization for Neffy  2MG /0.1ML solution  is required/requested.   Insurance verification completed.   The patient is insured through [employer] [facility] .   Per test claim: PA required; PA submitted to above mentioned insurance via CoverMyMeds Key/confirmation #/EOC [id number] Status is pending

## 2024-01-08 NOTE — Telephone Encounter (Signed)
 Approved today by [facility] [place]  Medicaid [place] Case: [id number], Status: Approved, Coverage Starts on: 01/08/2024 12:00:00 AM, Coverage Ends on: 01/07/2025 12:00:00 AM. Effective Date: 01/08/2024 Authorization Expiration Date: 01/07/2025

## 2024-01-11 DIAGNOSIS — F330 Major depressive disorder, recurrent, mild: Secondary | ICD-10-CM | POA: Diagnosis not present

## 2024-01-11 DIAGNOSIS — F411 Generalized anxiety disorder: Secondary | ICD-10-CM | POA: Diagnosis not present

## 2024-01-14 ENCOUNTER — Other Ambulatory Visit: Payer: Self-pay

## 2024-01-14 ENCOUNTER — Ambulatory Visit (HOSPITAL_COMMUNITY)
Admission: EM | Admit: 2024-01-14 | Discharge: 2024-01-14 | Disposition: A | Discharge status: Home / Self Care | Attending: Psychiatry | Admitting: Psychiatry

## 2024-01-14 DIAGNOSIS — F419 Anxiety disorder, unspecified: Secondary | ICD-10-CM

## 2024-01-14 DIAGNOSIS — F909 Attention-deficit hyperactivity disorder, unspecified type: Secondary | ICD-10-CM | POA: Insufficient documentation

## 2024-01-14 DIAGNOSIS — F5089 Other specified eating disorder: Secondary | ICD-10-CM

## 2024-01-14 DIAGNOSIS — F411 Generalized anxiety disorder: Secondary | ICD-10-CM | POA: Insufficient documentation

## 2024-01-14 DIAGNOSIS — F509 Eating disorder, unspecified: Secondary | ICD-10-CM | POA: Insufficient documentation
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
## 2024-01-14 NOTE — Plan of Care (Signed)
 Outpatient Therapist for Eating Disorders  Emmalene Irving Licensed Professional Counselor, MS, [facility], LCAS [address] [place], [place] [address] [phone]  Hadassah Leisure Licensed Professional Counselor, PhD, [facility], CEDS-S [facility] Clinical Supervision [facility] [address] [place], [place] [address] [phone]  Eleanor Aurora Counselor, PhD, [facility], NCC (she, ella) [address] [place], [place] [address] [phone]  Albino Viktoria Glatter Available online only [facility] [place], [place] [address] [phone] Counselor, MA, [facility], NCC, EDIP, CTP

## 2024-01-14 NOTE — Discharge Instructions (Signed)
 Follow up with resources provided

## 2024-01-14 NOTE — ED Provider Notes (Signed)
 [facility] Medical Screening Exam  Patient Name: Kelly Kim MRN: [id number] Date of Evaluation: 01/15/24 Chief Complaint:  increase anxiety  Diagnosis:  Final diagnoses:  Other disorder of eating  Anxious mood    History of Present illness: Kelly Kim is a 38 y.o. female. With a history of GAD, ADHD, and most recently diagnosed with alpha-gal syndrome.  According to the patient she currently sees a med provider and currently on Lexapro which she just started 2 to 3 weeks ago.according to patient she does have a Klonopin as needed to help her with any anxiety.  She also just started seeing a therapist and she only had 3 sessions with the therapist.  Patient stated that she has been having just increased anxiety related to her eating disorder that she was diagnosed with and this may occur afraid to eat anything.  She also stated that this happened more often when her husband is not home.  According to the patient she does have decreased sleep but stating that this only affected her when her husband is not home.  Patient currently lives with her husband and 2 kids currently works as a Psychologist, sport and exercise.    Face-to-face evaluation of patient, patient is alert and oriented x 4, speech is clear, maintain eye contact.  At this present moment patient showed no signs of immediate distress.  Does not seem to be influenced by internal stimuli.  Patient currently denies SI, HI, AVH or paranoia.  Denies ever being hospitalized for any psychiatric problems.  Patient denies alcohol use, reports she only smoke tobacco products.  Denies any illicit drug use at this time.  According to patient she wants to get some outpatient resources specialist somebody who specializes in eating disorder.  Writer also talked with social work who provided patient with outpatient resources to different therapists.  Patient was also asked if she felt that she was a danger to herself or others which she does deny  and did not want to be admitted for any reason.  At the time of this evaluation patient does not pose a risk to herself or others.  He is not in any immediate distress.  Patient is advised to call 911 or return to the nearest ED should she experience any suicidal thoughts homicidal ideation or hallucination.  Patient verbalized understanding.  Flowsheet Row ED from 01/14/2024 in [facility] ED from 01/02/2024 in [facility] Emergency Department at [facility] ED from 12/31/2023 in [facility] Emergency Department at [facility]  C-SSRS RISK CATEGORY No Risk No Risk No Risk    Psychiatric Specialty Exam  Presentation  General Appearance:Casual  Eye Contact:Good  Speech:Clear and Coherent  Speech Volume:Normal  Handedness:Right   Mood and Affect  Mood: Anxious  Affect: Appropriate   Thought Process  Thought Processes: Coherent  Descriptions of Associations:Intact  Orientation:Full (Time, Place and Person)  Thought Content:Logical    Hallucinations:None  Ideas of Reference:None  Suicidal Thoughts:No  Homicidal Thoughts:No   Sensorium  Memory: Immediate Fair  Judgment: Good  Insight: Fair   Executive Functions  Concentration: Good  Attention Span: Good  Recall: Good  Fund of Knowledge: Good  Language: Good   Psychomotor Activity  Psychomotor Activity: Normal   Assets  Assets: Communication Skills   Sleep  Sleep: Fair  Number of hours:  6   Physical Exam: Physical Exam HENT:     Head: Normocephalic.     Nose: Nose normal.  Eyes:  Pupils: Pupils are equal, round, and reactive to light.  Cardiovascular:     Rate and Rhythm: Normal rate.  Pulmonary:     Effort: Pulmonary effort is normal.  Musculoskeletal:        General: Normal range of motion.     Cervical back: Normal range of motion.  Neurological:     General: No focal deficit present.     Mental Status: She is alert.   Psychiatric:        Mood and Affect: Mood normal.        Thought Content: Thought content normal.    Review of Systems  Constitutional: Negative.   HENT: Negative.    Eyes: Negative.   Respiratory: Negative.    Cardiovascular: Negative.   Gastrointestinal: Negative.   Genitourinary: Negative.   Musculoskeletal: Negative.   Skin: Negative.   Neurological: Negative.   Psychiatric/Behavioral:  Positive for depression. The patient is nervous/anxious.    Blood pressure 118/83, pulse 100, temperature 98.8 F (37.1 C), temperature source Oral, resp. rate 18, last menstrual period 12/10/2023, SpO2 98%. There is no height or weight on file to calculate BMI.  Musculoskeletal: Strength & Muscle Tone: within normal limits Gait & Station: normal Patient leans: N/A   [facility] MSE Discharge Disposition for Follow up and Recommendations: Based on my evaluation the patient does not appear to have an emergency medical condition and can be discharged with resources and follow up care in outpatient services for Individual Therapy   Gaither Pouch, NP 01/15/2024, 4:47 AM

## 2024-01-18 ENCOUNTER — Other Ambulatory Visit: Payer: Self-pay

## 2024-01-18 DIAGNOSIS — F330 Major depressive disorder, recurrent, mild: Secondary | ICD-10-CM | POA: Diagnosis not present

## 2024-01-18 DIAGNOSIS — F411 Generalized anxiety disorder: Secondary | ICD-10-CM | POA: Diagnosis not present

## 2024-01-21 ENCOUNTER — Other Ambulatory Visit (HOSPITAL_COMMUNITY): Payer: Self-pay

## 2024-01-23 ENCOUNTER — Other Ambulatory Visit: Payer: Self-pay

## 2024-01-23 DIAGNOSIS — R7301 Impaired fasting glucose: Secondary | ICD-10-CM | POA: Diagnosis not present

## 2024-01-23 DIAGNOSIS — E039 Hypothyroidism, unspecified: Secondary | ICD-10-CM | POA: Diagnosis not present

## 2024-01-23 NOTE — Progress Notes (Signed)
 Specialty Pharmacy Refill Coordination Note  Kelly Kim is a 38 y.o. female contacted today regarding refills of specialty medication(s) Omalizumab  (XOLAIR )   Patient requested Courier to Provider Office   Delivery date: 01/24/24   Verified address: [facility] [address] [place], [place] [address]   Medication will be filled on 01/23/24.

## 2024-01-25 DIAGNOSIS — F330 Major depressive disorder, recurrent, mild: Secondary | ICD-10-CM | POA: Diagnosis not present

## 2024-01-25 DIAGNOSIS — F411 Generalized anxiety disorder: Secondary | ICD-10-CM | POA: Diagnosis not present

## 2024-01-28 ENCOUNTER — Ambulatory Visit

## 2024-01-28 DIAGNOSIS — Z91018 Allergy to other foods: Secondary | ICD-10-CM | POA: Diagnosis not present

## 2024-01-30 DIAGNOSIS — R7301 Impaired fasting glucose: Secondary | ICD-10-CM | POA: Diagnosis not present

## 2024-01-30 DIAGNOSIS — Z91018 Allergy to other foods: Secondary | ICD-10-CM | POA: Diagnosis not present

## 2024-01-30 DIAGNOSIS — R002 Palpitations: Secondary | ICD-10-CM | POA: Diagnosis not present

## 2024-01-30 DIAGNOSIS — F5089 Other specified eating disorder: Secondary | ICD-10-CM | POA: Diagnosis not present

## 2024-01-30 DIAGNOSIS — N943 Premenstrual tension syndrome: Secondary | ICD-10-CM | POA: Diagnosis not present

## 2024-01-30 DIAGNOSIS — Z0000 Encounter for general adult medical examination without abnormal findings: Secondary | ICD-10-CM | POA: Diagnosis not present

## 2024-01-30 DIAGNOSIS — E669 Obesity, unspecified: Secondary | ICD-10-CM | POA: Diagnosis not present

## 2024-01-30 DIAGNOSIS — D1803 Hemangioma of intra-abdominal structures: Secondary | ICD-10-CM | POA: Diagnosis not present

## 2024-01-30 DIAGNOSIS — E039 Hypothyroidism, unspecified: Secondary | ICD-10-CM | POA: Diagnosis not present

## 2024-01-30 DIAGNOSIS — K760 Fatty (change of) liver, not elsewhere classified: Secondary | ICD-10-CM | POA: Diagnosis not present

## 2024-01-30 DIAGNOSIS — F17219 Nicotine dependence, cigarettes, with unspecified nicotine-induced disorders: Secondary | ICD-10-CM | POA: Diagnosis not present

## 2024-01-30 DIAGNOSIS — F419 Anxiety disorder, unspecified: Secondary | ICD-10-CM | POA: Diagnosis not present

## 2024-02-01 DIAGNOSIS — F330 Major depressive disorder, recurrent, mild: Secondary | ICD-10-CM | POA: Diagnosis not present

## 2024-02-01 DIAGNOSIS — F411 Generalized anxiety disorder: Secondary | ICD-10-CM | POA: Diagnosis not present

## 2024-02-08 DIAGNOSIS — F330 Major depressive disorder, recurrent, mild: Secondary | ICD-10-CM | POA: Diagnosis not present

## 2024-02-08 DIAGNOSIS — F411 Generalized anxiety disorder: Secondary | ICD-10-CM | POA: Diagnosis not present

## 2024-02-13 ENCOUNTER — Other Ambulatory Visit: Payer: Self-pay

## 2024-02-13 NOTE — Progress Notes (Signed)
 Specialty Pharmacy Refill Coordination Note  Kelly Kim is a 38 y.o. female assessed today regarding refills of clinic administered specialty medication(s) Omalizumab  (XOLAIR )   Clinic requested Courier to Provider Office   Delivery date: 02/18/24   Verified address: [facility] [address] [place], [place] [address]   Medication will be filled on 02/15/24.

## 2024-02-14 ENCOUNTER — Other Ambulatory Visit: Payer: Self-pay

## 2024-02-14 DIAGNOSIS — N760 Acute vaginitis: Secondary | ICD-10-CM | POA: Diagnosis not present

## 2024-02-14 DIAGNOSIS — B9689 Other specified bacterial agents as the cause of diseases classified elsewhere: Secondary | ICD-10-CM | POA: Diagnosis not present

## 2024-02-14 DIAGNOSIS — R350 Frequency of micturition: Secondary | ICD-10-CM | POA: Diagnosis not present

## 2024-02-14 DIAGNOSIS — F330 Major depressive disorder, recurrent, mild: Secondary | ICD-10-CM | POA: Diagnosis not present

## 2024-02-14 DIAGNOSIS — F411 Generalized anxiety disorder: Secondary | ICD-10-CM | POA: Diagnosis not present

## 2024-02-21 DIAGNOSIS — D259 Leiomyoma of uterus, unspecified: Secondary | ICD-10-CM | POA: Diagnosis not present

## 2024-02-21 DIAGNOSIS — F330 Major depressive disorder, recurrent, mild: Secondary | ICD-10-CM | POA: Diagnosis not present

## 2024-02-21 DIAGNOSIS — N83201 Unspecified ovarian cyst, right side: Secondary | ICD-10-CM | POA: Diagnosis not present

## 2024-02-21 DIAGNOSIS — F411 Generalized anxiety disorder: Secondary | ICD-10-CM | POA: Diagnosis not present

## 2024-02-21 DIAGNOSIS — N760 Acute vaginitis: Secondary | ICD-10-CM | POA: Diagnosis not present

## 2024-02-21 DIAGNOSIS — N83202 Unspecified ovarian cyst, left side: Secondary | ICD-10-CM | POA: Diagnosis not present

## 2024-02-25 ENCOUNTER — Ambulatory Visit (INDEPENDENT_AMBULATORY_CARE_PROVIDER_SITE_OTHER)

## 2024-02-25 ENCOUNTER — Ambulatory Visit: Admitting: Internal Medicine

## 2024-02-25 DIAGNOSIS — Z91018 Allergy to other foods: Secondary | ICD-10-CM

## 2024-02-28 ENCOUNTER — Ambulatory Visit
Admission: EM | Admit: 2024-02-28 | Discharge: 2024-02-28 | Disposition: A | Discharge status: Home / Self Care | Attending: Family Medicine | Admitting: Family Medicine

## 2024-02-28 ENCOUNTER — Other Ambulatory Visit: Payer: Self-pay

## 2024-02-28 ENCOUNTER — Encounter: Payer: Self-pay | Admitting: Emergency Medicine

## 2024-02-28 DIAGNOSIS — B370 Candidal stomatitis: Secondary | ICD-10-CM

## 2024-02-28 MED ORDER — NYSTATIN 100000 UNIT/ML MT SUSP: 500000.0000 [IU] | Freq: Four times a day (QID) | OROMUCOSAL | 0 refills | Status: AC

## 2024-02-28 NOTE — ED Triage Notes (Signed)
 Pt reports possible oral thrush and loose stools for last several days. Pt reports was px abx last week. Denies any otc medications today.
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
## 2024-03-07 ENCOUNTER — Other Ambulatory Visit: Payer: Self-pay

## 2024-03-07 DIAGNOSIS — F330 Major depressive disorder, recurrent, mild: Secondary | ICD-10-CM | POA: Diagnosis not present

## 2024-03-07 DIAGNOSIS — F411 Generalized anxiety disorder: Secondary | ICD-10-CM | POA: Diagnosis not present

## 2024-03-13 DIAGNOSIS — F330 Major depressive disorder, recurrent, mild: Secondary | ICD-10-CM | POA: Diagnosis not present

## 2024-03-13 DIAGNOSIS — F411 Generalized anxiety disorder: Secondary | ICD-10-CM | POA: Diagnosis not present

## 2024-03-13 DIAGNOSIS — L821 Other seborrheic keratosis: Secondary | ICD-10-CM | POA: Diagnosis not present

## 2024-03-13 DIAGNOSIS — Z808 Family history of malignant neoplasm of other organs or systems: Secondary | ICD-10-CM | POA: Diagnosis not present

## 2024-03-13 DIAGNOSIS — L573 Poikiloderma of Civatte: Secondary | ICD-10-CM | POA: Diagnosis not present

## 2024-03-13 DIAGNOSIS — D1801 Hemangioma of skin and subcutaneous tissue: Secondary | ICD-10-CM | POA: Diagnosis not present

## 2024-03-13 DIAGNOSIS — L720 Epidermal cyst: Secondary | ICD-10-CM | POA: Diagnosis not present

## 2024-03-14 ENCOUNTER — Other Ambulatory Visit: Payer: Self-pay

## 2024-03-18 ENCOUNTER — Other Ambulatory Visit: Payer: Self-pay

## 2024-03-20 ENCOUNTER — Other Ambulatory Visit: Payer: Self-pay

## 2024-03-20 ENCOUNTER — Other Ambulatory Visit: Payer: Self-pay | Admitting: Pharmacy Technician

## 2024-03-20 NOTE — Progress Notes (Signed)
 Specialty Pharmacy Refill Coordination Note  Kelly Kim is a 38 y.o. female contacted today regarding refills of specialty medication(s) Omalizumab  (XOLAIR )   Patient requested Courier to Provider Office   Delivery date: 03/24/24   Verified address: [facility] [address] [place]   Medication will be filled on 03/20/2024.

## 2024-03-21 ENCOUNTER — Ambulatory Visit

## 2024-03-21 ENCOUNTER — Ambulatory Visit: Admitting: Family Medicine

## 2024-03-24 ENCOUNTER — Ambulatory Visit

## 2024-03-25 DIAGNOSIS — Z124 Encounter for screening for malignant neoplasm of cervix: Secondary | ICD-10-CM | POA: Diagnosis not present

## 2024-03-25 DIAGNOSIS — Z0000 Encounter for general adult medical examination without abnormal findings: Secondary | ICD-10-CM | POA: Diagnosis not present

## 2024-03-29 DIAGNOSIS — F330 Major depressive disorder, recurrent, mild: Secondary | ICD-10-CM | POA: Diagnosis not present

## 2024-03-29 DIAGNOSIS — F411 Generalized anxiety disorder: Secondary | ICD-10-CM | POA: Diagnosis not present

## 2024-04-03 DIAGNOSIS — F330 Major depressive disorder, recurrent, mild: Secondary | ICD-10-CM | POA: Diagnosis not present

## 2024-04-03 DIAGNOSIS — F411 Generalized anxiety disorder: Secondary | ICD-10-CM | POA: Diagnosis not present

## 2024-04-10 DIAGNOSIS — F411 Generalized anxiety disorder: Secondary | ICD-10-CM | POA: Diagnosis not present

## 2024-04-10 DIAGNOSIS — F330 Major depressive disorder, recurrent, mild: Secondary | ICD-10-CM | POA: Diagnosis not present

## 2024-04-14 ENCOUNTER — Ambulatory Visit: Admitting: Internal Medicine

## 2024-04-21 ENCOUNTER — Ambulatory Visit

## 2024-04-21 ENCOUNTER — Ambulatory Visit: Admitting: Internal Medicine

## 2024-04-21 ENCOUNTER — Encounter: Payer: Self-pay | Admitting: Internal Medicine

## 2024-04-21 VITALS — BP 118/74 | HR 90 | Temp 98.4°F | Wt 220.2 lb

## 2024-04-21 DIAGNOSIS — L508 Other urticaria: Secondary | ICD-10-CM

## 2024-04-21 DIAGNOSIS — Z91018 Allergy to other foods: Secondary | ICD-10-CM | POA: Diagnosis not present

## 2024-04-21 MED ORDER — NEFFY 2 MG/0.1ML NA SOLN: 2.0000 mg | NASAL | 1 refills | Status: AC | PRN

## 2024-04-21 NOTE — Patient Instructions (Addendum)
 Alpha Gal Syndrome: - please strictly avoid all mammalian meat. Okay to eat chicken, turkey, seafood.   - for SKIN only reaction, okay to take Benadryl  2 teaspoonful every 6 hours as needed - for SKIN + ANY additional symptoms, OR IF concern for LIFE THREATENING reaction = Epipen  Autoinjector EpiPen  0.3 mg or Neffy  2mg  - If using Epinephrine  autoinjector, call 911 or go to the ER.     Chronic Urticaria/Itching: - Use Zyrtec  10mg  twice daily as needed.

## 2024-04-21 NOTE — Progress Notes (Signed)
   FOLLOW UP Date of Service/Encounter:  04/21/24   Subjective:  Kelly Kim (DOB: December 12, 1985) is a 38 y.o. female [facility] for follow up for alpha gal and chronic hives.   History obtained from: chart review and patient. Last visit was with Dr Iva on 12/10/2023 and at the time, they discussed adding Xolair  for hives/food allergy.  Notes having significant fatigue and diarrhea with Xolair  for about a week post shot and wishes to discontinue.  She is very careful with her diet and avoids all mammalian meat, gelatin and dairy. She has lost a lot of weight and is happy about this.  No issues with hives/itching.  Has an Epipen  but never picked up the Neffy  from specialty pharmacy.  History has also been complicated by anxiety/panic attacks.    Past Medical History: Past Medical History:  Diagnosis Date   Alpha-gal syndrome    Anxiety    Blood glucose abnormal 09/10/2023   passed 3 hour GTT     Chest pain    last few weeks ago   Chronic migraine w/o aura w/o status migrainosus, not intractable 05/17/2023   Early satiety 06/05/2023   Gastroesophageal reflux disease 09/10/2023   Hx: UTI (urinary tract infection)    Hypothyroid    Liver lesion 07/11/2023   LUQ abdominal pain 06/05/2023   Menorrhagia 09/10/2023   Migraine 09/10/2023   Morbid obesity (HCC) 09/10/2023   Obstructive sleep apnea 09/10/2023   Palpitations    Premenstrual dysphoric disorder 09/10/2023   Premenstrual tension syndrome    Proteinuria 09/10/2023   Chronic pre pregnancy: Seen by MFM, see plan below.     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 Physical Exam: GEN: alert, well developed HEENT: clear conjunctiva, nose without rhinorrhea.  HEART: regular rate and rhythm, no murmur LUNGS: clear to auscultation bilaterally, no coughing, unlabored respiration SKIN: no rashes or lesions   Assessment:   1. Chronic urticaria   2. Allergy to alpha-gal     Plan/Recommendations:   Alpha Gal Syndrome: - please strictly avoid all mammalian meat. Okay to eat chicken, turkey, seafood.   - Initial rxn: hives/diarrhea/low BP about 6-7 years ago, stomach pain/diarrhea with red meat ingestion.  - 07/2023: alpha gal IgE 9.92;  12/2023: alpha gal IgE 6.87 - for SKIN only reaction, okay to take Benadryl  2 teaspoonful every 6 hours as needed - for SKIN + ANY additional symptoms, OR IF concern for LIFE THREATENING reaction = Epipen  Autoinjector EpiPen  0.3 mg or Neffy  2mg  - If using Epinephrine  autoinjector, call 911 or go to the ER.     Chronic Urticaria/Itching: - Controlled  - Use Zyrtec  10mg  twice daily as needed.     Return in about 6 months (around 10/19/2024).  Arleta Blanch, MD [facility]

## 2024-04-22 DIAGNOSIS — N809 Endometriosis, unspecified: Secondary | ICD-10-CM | POA: Diagnosis not present

## 2024-04-22 DIAGNOSIS — N83299 Other ovarian cyst, unspecified side: Secondary | ICD-10-CM | POA: Diagnosis not present

## 2024-05-08 DIAGNOSIS — F411 Generalized anxiety disorder: Secondary | ICD-10-CM | POA: Diagnosis not present

## 2024-05-08 DIAGNOSIS — F909 Attention-deficit hyperactivity disorder, unspecified type: Secondary | ICD-10-CM | POA: Diagnosis not present

## 2024-05-08 DIAGNOSIS — F32A Depression, unspecified: Secondary | ICD-10-CM | POA: Diagnosis not present

## 2024-05-09 ENCOUNTER — Other Ambulatory Visit (HOSPITAL_COMMUNITY): Payer: Self-pay

## 2024-05-12 DIAGNOSIS — F330 Major depressive disorder, recurrent, mild: Secondary | ICD-10-CM | POA: Diagnosis not present

## 2024-05-12 DIAGNOSIS — F411 Generalized anxiety disorder: Secondary | ICD-10-CM | POA: Diagnosis not present

## 2024-05-15 ENCOUNTER — Other Ambulatory Visit: Payer: Self-pay

## 2024-05-16 ENCOUNTER — Other Ambulatory Visit (HOSPITAL_COMMUNITY): Payer: Self-pay

## 2024-05-20 ENCOUNTER — Other Ambulatory Visit: Payer: Self-pay

## 2024-05-20 ENCOUNTER — Other Ambulatory Visit (HOSPITAL_COMMUNITY): Payer: Self-pay

## 2024-05-20 NOTE — Progress Notes (Signed)
 Disenrolling patient from [facility].   Date of office visit: 04/21/24   Reason: Patient reports significant fatigue and diarrhea while on therapy. Patient asked to stop Xolair  at this visit.

## 2024-05-23 ENCOUNTER — Other Ambulatory Visit: Payer: Self-pay | Admitting: Family Medicine

## 2024-08-04 ENCOUNTER — Ambulatory Visit: Admitting: Internal Medicine

## 2024-08-27 ENCOUNTER — Ambulatory Visit (HOSPITAL_COMMUNITY): Admit: 2024-08-27 | Admitting: Obstetrics and Gynecology

## 2024-09-22 ENCOUNTER — Telehealth: Admitting: Adult Health
